# Patient Record
Sex: Female | Born: 1950
Health system: Southern US, Community
[De-identification: ages and names within clinical notes are randomized; demographics above are authoritative.]

## PROBLEM LIST (undated history)

## (undated) DIAGNOSIS — K219 Gastro-esophageal reflux disease without esophagitis: Secondary | ICD-10-CM

## (undated) DIAGNOSIS — T7840XA Allergy, unspecified, initial encounter: Secondary | ICD-10-CM

## (undated) DIAGNOSIS — B009 Herpesviral infection, unspecified: Secondary | ICD-10-CM

## (undated) DIAGNOSIS — K5900 Constipation, unspecified: Secondary | ICD-10-CM

## (undated) DIAGNOSIS — E785 Hyperlipidemia, unspecified: Secondary | ICD-10-CM

## (undated) DIAGNOSIS — H269 Unspecified cataract: Secondary | ICD-10-CM

## (undated) HISTORY — PX: EXPLORATORY LAPAROTOMY: SUR591

## (undated) HISTORY — PX: WISDOM TOOTH EXTRACTION: SHX21

## (undated) HISTORY — DX: Unspecified cataract: H26.9

## (undated) HISTORY — PX: RETINAL DETACHMENT SURGERY: SHX105

## (undated) HISTORY — PX: CATARACT EXTRACTION: SUR2

## (undated) HISTORY — PX: APPENDECTOMY: SHX54

## (undated) HISTORY — DX: Hyperlipidemia, unspecified: E78.5

## (undated) HISTORY — DX: Gastro-esophageal reflux disease without esophagitis: K21.9

## (undated) HISTORY — PX: VITRECTOMY: SHX106

## (undated) HISTORY — PX: EYE SURGERY: SHX253

## (undated) HISTORY — DX: Herpesviral infection, unspecified: B00.9

## (undated) HISTORY — DX: Constipation, unspecified: K59.00

## (undated) HISTORY — PX: VITRECTOMY AND CATARACT: SHX6184

## (undated) HISTORY — DX: Allergy, unspecified, initial encounter: T78.40XA

---

## 2000-03-04 ENCOUNTER — Encounter: Payer: Self-pay | Admitting: Family Medicine

## 2000-03-04 ENCOUNTER — Encounter: Admission: RE | Admit: 2000-03-04 | Discharge: 2000-03-04 | Payer: Self-pay | Admitting: Family Medicine

## 2001-04-01 ENCOUNTER — Encounter: Payer: Self-pay | Admitting: Family Medicine

## 2001-04-01 ENCOUNTER — Encounter: Admission: RE | Admit: 2001-04-01 | Discharge: 2001-04-01 | Payer: Self-pay | Admitting: Family Medicine

## 2002-09-09 ENCOUNTER — Encounter: Admission: RE | Admit: 2002-09-09 | Discharge: 2002-09-09 | Payer: Self-pay | Admitting: Family Medicine

## 2002-09-09 ENCOUNTER — Encounter: Payer: Self-pay | Admitting: Family Medicine

## 2003-09-11 ENCOUNTER — Encounter: Admission: RE | Admit: 2003-09-11 | Discharge: 2003-09-11 | Payer: Self-pay | Admitting: Family Medicine

## 2003-09-22 ENCOUNTER — Other Ambulatory Visit: Admission: RE | Admit: 2003-09-22 | Discharge: 2003-09-22 | Payer: Self-pay | Admitting: Internal Medicine

## 2004-09-16 ENCOUNTER — Encounter: Admission: RE | Admit: 2004-09-16 | Discharge: 2004-09-16 | Payer: Self-pay | Admitting: Family Medicine

## 2004-11-14 ENCOUNTER — Other Ambulatory Visit: Admission: RE | Admit: 2004-11-14 | Discharge: 2004-11-14 | Payer: Self-pay | Admitting: Family Medicine

## 2005-07-10 ENCOUNTER — Encounter: Admission: RE | Admit: 2005-07-10 | Discharge: 2005-07-10 | Payer: Self-pay | Admitting: Family Medicine

## 2005-10-16 ENCOUNTER — Encounter: Admission: RE | Admit: 2005-10-16 | Discharge: 2005-10-16 | Payer: Self-pay | Admitting: Family Medicine

## 2005-11-26 ENCOUNTER — Encounter: Payer: Self-pay | Admitting: Family Medicine

## 2006-08-19 ENCOUNTER — Ambulatory Visit: Payer: Self-pay | Admitting: Family Medicine

## 2006-10-19 ENCOUNTER — Encounter: Admission: RE | Admit: 2006-10-19 | Discharge: 2006-10-19 | Payer: Self-pay | Admitting: Family Medicine

## 2006-11-02 ENCOUNTER — Ambulatory Visit: Payer: Self-pay | Admitting: Family Medicine

## 2006-11-03 LAB — CONVERTED CEMR LAB
CO2: 30 meq/L (ref 19–32)
Chloride: 103 meq/L (ref 96–112)
Creatinine, Ser: 0.9 mg/dL (ref 0.4–1.2)
GFR calc non Af Amer: 69 mL/min
Potassium: 4.3 meq/L (ref 3.5–5.1)
Sodium: 138 meq/L (ref 135–145)

## 2006-11-04 ENCOUNTER — Encounter: Payer: Self-pay | Admitting: Family Medicine

## 2006-11-10 ENCOUNTER — Ambulatory Visit: Payer: Self-pay | Admitting: Family Medicine

## 2006-11-10 LAB — CONVERTED CEMR LAB
Basophils Relative: 0.9 % (ref 0.0–1.0)
Cholesterol: 208 mg/dL (ref 0–200)
Eosinophils Relative: 5.5 % — ABNORMAL HIGH (ref 0.0–5.0)
HCT: 42.7 % (ref 36.0–46.0)
HDL: 61.2 mg/dL (ref >39.0)
Hemoglobin: 14.7 g/dL (ref 12.0–15.0)
MCV: 95.8 fL (ref 78.0–100.0)
Monocytes Absolute: 0.5 10*3/uL (ref 0.2–0.7)
Monocytes Relative: 7.2 % (ref 3.0–11.0)
Neutrophils Relative %: 61.3 % (ref 43.0–77.0)
RDW: 12.5 % (ref 11.5–14.6)
TSH: 4.41 microintl units/mL (ref 0.35–5.50)
Triglycerides: 90 mg/dL (ref 0–149)
VLDL: 18 mg/dL (ref 0–40)
WBC: 7.3 10*3/uL (ref 4.5–10.5)

## 2007-10-22 ENCOUNTER — Encounter: Admission: RE | Admit: 2007-10-22 | Discharge: 2007-10-22 | Payer: Self-pay | Admitting: Family Medicine

## 2007-10-27 ENCOUNTER — Ambulatory Visit: Payer: Self-pay | Admitting: Family Medicine

## 2007-10-27 DIAGNOSIS — S6390XA Sprain of unspecified part of unspecified wrist and hand, initial encounter: Secondary | ICD-10-CM | POA: Insufficient documentation

## 2007-12-08 ENCOUNTER — Ambulatory Visit: Payer: Self-pay | Admitting: Family Medicine

## 2007-12-09 ENCOUNTER — Encounter (INDEPENDENT_AMBULATORY_CARE_PROVIDER_SITE_OTHER): Payer: Self-pay | Admitting: *Deleted

## 2007-12-09 LAB — CONVERTED CEMR LAB
HDL: 55.3 mg/dL (ref >39.0)
TSH: 2.85 microintl units/mL (ref 0.35–5.50)
Triglycerides: 194 mg/dL — ABNORMAL HIGH (ref 0–149)
VLDL: 39 mg/dL (ref 0–40)

## 2008-09-15 ENCOUNTER — Encounter (INDEPENDENT_AMBULATORY_CARE_PROVIDER_SITE_OTHER): Payer: Self-pay | Admitting: *Deleted

## 2008-10-24 ENCOUNTER — Encounter: Admission: RE | Admit: 2008-10-24 | Discharge: 2008-10-24 | Payer: Self-pay | Admitting: Obstetrics and Gynecology

## 2008-12-12 ENCOUNTER — Ambulatory Visit: Payer: Self-pay | Admitting: Family Medicine

## 2008-12-13 LAB — CONVERTED CEMR LAB
AST: 31 units/L (ref 0–37)
Albumin: 4 g/dL (ref 3.5–5.2)
Basophils Absolute: 0 10*3/uL (ref 0.0–0.1)
Bilirubin, Direct: 0.1 mg/dL (ref 0.0–0.3)
Calcium: 9.5 mg/dL (ref 8.4–10.5)
Creatinine, Ser: 0.9 mg/dL (ref 0.4–1.2)
Eosinophils Absolute: 0.4 10*3/uL (ref 0.0–0.7)
GFR calc Af Amer: 83 mL/min
GFR calc non Af Amer: 69 mL/min
HCT: 40 % (ref 36.0–46.0)
HDL: 60.5 mg/dL (ref >39.0)
Hemoglobin: 14.1 g/dL (ref 12.0–15.0)
Lymphocytes Relative: 23.3 % (ref 12.0–46.0)
MCV: 95.6 fL (ref 78.0–100.0)
Potassium: 4.5 meq/L (ref 3.5–5.1)
RDW: 12.4 % (ref 11.5–14.6)
Sodium: 142 meq/L (ref 135–145)
Total Bilirubin: 0.6 mg/dL (ref 0.3–1.2)
Total CHOL/HDL Ratio: 3.6
Total Protein: 7.2 g/dL (ref 6.0–8.3)

## 2009-10-25 ENCOUNTER — Encounter: Admission: RE | Admit: 2009-10-25 | Discharge: 2009-10-25 | Payer: Self-pay | Admitting: Obstetrics and Gynecology

## 2009-10-31 ENCOUNTER — Encounter: Admission: RE | Admit: 2009-10-31 | Discharge: 2009-10-31 | Payer: Self-pay | Admitting: Obstetrics and Gynecology

## 2009-12-18 ENCOUNTER — Encounter: Payer: Self-pay | Admitting: Family Medicine

## 2009-12-18 ENCOUNTER — Encounter (INDEPENDENT_AMBULATORY_CARE_PROVIDER_SITE_OTHER): Payer: Self-pay | Admitting: *Deleted

## 2009-12-18 ENCOUNTER — Ambulatory Visit: Payer: Self-pay | Admitting: Family

## 2009-12-18 DIAGNOSIS — K5909 Other constipation: Secondary | ICD-10-CM | POA: Insufficient documentation

## 2009-12-18 LAB — CONVERTED CEMR LAB
AST: 27 units/L (ref 0–37)
BUN: 10 mg/dL (ref 6–23)
Basophils Absolute: 0.1 10*3/uL (ref 0.0–0.1)
Bilirubin, Direct: 0.1 mg/dL (ref 0.0–0.3)
CO2: 30 meq/L (ref 19–32)
Calcium: 9.5 mg/dL (ref 8.4–10.5)
Cholesterol: 213 mg/dL — ABNORMAL HIGH (ref 0–200)
Creatinine, Ser: 0.8 mg/dL (ref 0.4–1.2)
Eosinophils Absolute: 0.4 10*3/uL (ref 0.0–0.7)
Eosinophils Relative: 4.6 % (ref 0.0–5.0)
Glucose, Bld: 80 mg/dL (ref 70–99)
HCT: 41.6 % (ref 36.0–46.0)
HDL: 73 mg/dL (ref >39.00)
Hemoglobin: 14 g/dL (ref 12.0–15.0)
Neutrophils Relative %: 65.1 % (ref 43.0–77.0)
Platelets: 301 10*3/uL (ref 150.0–400.0)
Potassium: 3.8 meq/L (ref 3.5–5.1)
RDW: 12.5 % (ref 11.5–14.6)
Total CHOL/HDL Ratio: 3
Total Protein: 7.3 g/dL (ref 6.0–8.3)

## 2010-01-07 ENCOUNTER — Ambulatory Visit: Payer: Self-pay | Admitting: Family

## 2010-08-14 ENCOUNTER — Telehealth (INDEPENDENT_AMBULATORY_CARE_PROVIDER_SITE_OTHER): Payer: Self-pay | Admitting: *Deleted

## 2010-11-07 ENCOUNTER — Other Ambulatory Visit: Payer: Self-pay | Admitting: Family Medicine

## 2010-11-07 ENCOUNTER — Ambulatory Visit
Admission: RE | Admit: 2010-11-07 | Discharge: 2010-11-07 | Payer: Self-pay | Source: Home / Self Care | Attending: Family Medicine | Admitting: Family Medicine

## 2010-11-07 DIAGNOSIS — R238 Other skin changes: Secondary | ICD-10-CM | POA: Insufficient documentation

## 2010-11-07 DIAGNOSIS — K219 Gastro-esophageal reflux disease without esophagitis: Secondary | ICD-10-CM | POA: Insufficient documentation

## 2010-11-07 DIAGNOSIS — R1310 Dysphagia, unspecified: Secondary | ICD-10-CM | POA: Insufficient documentation

## 2010-11-07 LAB — H. PYLORI ANTIBODY, IGG: H Pylori IgG: NEGATIVE

## 2010-11-10 ENCOUNTER — Encounter: Payer: Self-pay | Admitting: Family Medicine

## 2010-11-10 ENCOUNTER — Encounter: Payer: Self-pay | Admitting: Obstetrics and Gynecology

## 2010-11-19 NOTE — Letter (Signed)
   [facility] [address] [place], [place] [address] [phone]   January 07, 2010   PINA SIRIANNI [address] [place], [place] [address]  RE:  LAB RESULTS  Dear  Ms. Friesen,  The following is an interpretation of your most recent lab tests.  Please take note of any instructions provided or changes to medications that have resulted from your lab work.  Hemoccult Test for blood in stool:  negative      Sincerely Yours,    Lemont Fillers FNP

## 2010-11-19 NOTE — Assessment & Plan Note (Signed)
Summary: CPX/NS/KDC   Vital Signs:  Patient profile:   60 year old female Weight:      158.50 pounds BMI:     25.10 Temp:     98.1 degrees F oral Pulse rate:   76 / minute Pulse rhythm:   regular Resp:     16 per minute BP sitting:   130 / 82 Cuff size:   regular  Vitals Entered By: Mervin Kung CMA (December 18, 2009 10:10 AM) CC: room 17 Annual physical Is Patient Diabetic? No Comments Wants cholesterol checked Pt wants a  bone density test.     Last PAP Result normal   CC:  room 17 Annual physical.  History of Present Illness: [name] is a 60 year old female who presents today for CPX.    Constipation-  Notes that she has been using prunes with minimal relief.  Has 3 hard BM's a week.  Preventative- recent mammo 1/11 normal, scheduled for PAP in a few weeks.  Normal colo 2006.  + excercise- walks for 20-25 minutes.  Diet is "alright" could eat more fruits and veggies   Preventive Screening-Counseling & Management  Alcohol-Tobacco     Smoking Status: never  Allergies: 1)  ! Cortisone  Past History:  Past Medical History: Last updated: 12/12/2008 herpes  Past Surgical History: Last updated: 12/08/2007 Appendectomy Laparotomy-exploratory  Family History: Last updated: 12/12/2008 CAD - 2 sisters with an arrythmia HTN - sisters DM - no stroke - F colon Ca - no breast Ca - no F deceased - pneumonia M deceased - CHF age 72  Social History: Last updated: 12/18/2009 Occupation: works as Network engineer professor Never Smoked Alcohol use-yes: wine weekly- 5 glasses/week Drug use-no Regular exercise-yes  Risk Factors: Exercise: yes (12/08/2007)  Risk Factors: Smoking Status: never (12/18/2009)  Family History: Reviewed history from 12/12/2008 and no changes required. 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 Resp: Denies cough CV:  Denies Chest Pain GI:  Denies nausea or vomitting GU: Denies dysuria Lymphatic: Denies lymphadenopathy Musculoskeletal:  Denies muscle, occasional left knee pain Skin:  Denies Rashes Psychiatric: Denies depression or anxiety Neuro: Denies numbness     Physical Exam  General:  Well-developed,well-nourished,in no acute distress; alert,appropriate and cooperative throughout examination Head:  Normocephalic and atraumatic without obvious abnormalities. No apparent alopecia or balding. Eyes:  PERRLA Ears:  External ear exam shows no significant lesions or deformities.  Otoscopic examination reveals clear canals, tympanic membranes are intact bilaterally without bulging, retraction, inflammation or discharge. Hearing is grossly normal bilaterally. Mouth:  Oral mucosa and oropharynx without lesions or exudates.  Teeth in good repair. Neck:  No deformities, masses, or tenderness noted. Breasts:  declined- pt to see GYN Lungs:  Normal respiratory effort, chest expands symmetrically. Lungs are clear to auscultation, no crackles or wheezes. Heart:  Normal rate and regular rhythm. S1 and S2 normal without gallop, murmur, click, rub or other extra sounds. Abdomen:  Bowel sounds positive,abdomen soft and non-tender without masses, organomegaly or hernias noted. Msk:  No deformity or scoliosis noted of thoracic or lumbar spine.  Neurologic:  No cranial nerve deficits noted. Station and gait are normal. Plantar reflexes are down-going bilaterally. DTRs are symmetrical throughout. Sensory, motor and coordinative functions appear intact. Skin:  Intact without suspicious lesions or rashes Psych:  Cognition and judgment appear  intact. Alert and cooperative with normal attention span and concentration. No apparent delusions, illusions, hallucinations   Impression & Recommendations:  Problem # 1:  PREVENTIVE HEALTH CARE (ICD-V70.0) Assessment Comment Only EKG NSR rate 60 BPM.  Counselled on diet and exercise.  Immunizations reviewed.   Orders: Venipuncture (47829) TLB-Lipid Panel (80061-LIPID) TLB-CBC Platelet - w/Differential (85025-CBCD) TLB-BMP (Basic Metabolic Panel-BMET) (80048-METABOL) TLB-Hepatic/Liver Function Pnl (80076-HEPATIC)  Problem # 2:  CONSTIPATION, CHRONIC (ICD-564.09) Assessment: New Recommeded trial of colace, increase H2O intake and increase fiber Her updated medication list for this problem includes:    Colace 100 Mg Caps (Docusate sodium) ..... One cap by mouth two times a day  Complete Medication List: 1)  Valtrex 1 Gm Tabs (Valacyclovir hcl) .... Take one tablet daily 2)  Colace 100 Mg Caps (Docusate sodium) .... One cap by mouth two times a day  Patient Instructions: 1)  Drink 6-8 glasses of water a day. 2)  Try to eat more fruits and veggies 3)  Talk to your GYN about scheduling a bone density.   Current Allergies (reviewed today): ! CORTISONE   Preventive Care Screening  Pap Smear:    Date:  12/18/2009    Results:  normal  Mammogram:    Date:  10/22/2009    Results:  normal

## 2010-11-19 NOTE — Letter (Signed)
Summary: Cancer Screening/[facility] Personalized Risk Profile  Cancer Screening/[facility] Personalized Risk Profile   Imported By: Lanelle Bal 12/25/2009 11:55:13  _____________________________________________________________________  External Attachment:    Type:   Image     Comment:   External Document

## 2010-11-19 NOTE — Letter (Signed)
   [facility] [address] [place], [place] [address] [phone]    December 18, 2009   Leea Werber [address] [place], [place] [address]  RE:  LAB RESULTS  Dear  Ms. Tipps,  The following is an interpretation of your most recent lab tests.  Please take note of any instructions provided or changes to medications that have resulted from your lab work.  ELECTROLYTES:  Good - no changes needed  KIDNEY FUNCTION TESTS:  Good - no changes needed  LIVER FUNCTION TESTS:  Good - no changes needed  LIPID PANEL:  Stable - no changes needed Triglyceride: 106.0   Cholesterol: 213   LDL: DEL   HDL: 73.00   Chol/HDL%:  3   DIABETIC STUDIES:  Excellent - no changes needed Blood Glucose: 80     CBC:  Good - no changes needed   Sincerely Yours,    Lemont Fillers FNP

## 2010-11-19 NOTE — Letter (Signed)
Summary: [facility]: Immunoassay Fecal Occult Blood (iFOB) Order Form  [facility] [place]  [address] [place], [place] [address]   Phone: [phone]  Fax: [phone]      [facility]: Immunoassay Fecal Occult Blood (iFOB) Order Form   December 18, 2009 MRN: [id number]   KALEB LINQUIST [birth year]   Physicican Name:____Melissa O'Sullivan__________  Diagnosis Code:_____v70.0_____________________      Mervin Kung CMA

## 2010-11-19 NOTE — Progress Notes (Signed)
Summary: Valacyclovir refill at different pharmacy  Phone Note Refill Request Message from:  Patient on August 14, 2010 12:58 PM  Refills Requested: Medication #1:  VALTREX 1 GM  TABS Take one tablet daily patient was using [facility], but now wants to use [facility] [address] because prescription is cheaper at [facility] wants the generic Valacyclovir  Initial call taken by: Jerolyn Shin,  August 14, 2010 1:00 PM    Prescriptions: VALTREX 1 GM  TABS (VALACYCLOVIR HCL) Take one tablet daily  #30 Each x 1   Entered by:   Doristine Devoid CMA   Authorized by:   Neena Rhymes MD   Signed by:   Doristine Devoid CMA on 08/14/2010   Method used:   Electronically to        [facility] [address] (retail)       [phone]       [place], [place]  [address]       Ph: [phone]       Fax: [phone]   RxID:   [id number]

## 2010-11-21 NOTE — Assessment & Plan Note (Signed)
Summary: problems with acid reflux/nta   Vital Signs:  Patient profile:   60 year old female Weight:      165 pounds BMI:     26.13 Pulse rate:   76 / minute BP sitting:   116 / 80  (left arm)  Vitals Entered By: Doristine Devoid CMA (November 07, 2010 1:33 PM) CC: reflux worse at night tried Tums w/ some relief and check spot on R inner thigh   History of Present Illness: 60 yo woman here today for  1) GERD- sxs occuring nightly.  has tried Tums w/ some relief, licorice extract (made her sick).  is having burning up into chest.  will have feeling of food sticking 1/2 down, doesn't occur every time she eats and doesn't occur w/ liquids.  has never had similar sxs.  has never had endoscopy.  attempts to stop eating before 7 pm.  sxs severity will vary based on intake.  no recent wt loss.  no vomiting.  2) hyperpigmentation of R inner thigh- doesn't itch or flake, no pain, no similar lesions elsewhere.  pt thinks it appeared after she had sun exposure and her tan faded.  not sure if she should be concerned about this.  fears skin cancer  Preventive Screening-Counseling & Management  Alcohol-Tobacco     Alcohol drinks/day: <1     Smoking Status: never  Current Medications (verified): 1)  Valtrex 1 Gm  Tabs (Valacyclovir Hcl) .... Take One Tablet Daily 2)  Colace 100 Mg Caps (Docusate Sodium) .... One Cap By Mouth Two Times A Day  Allergies (verified): 1)  ! Cortisone  Past History:  Past medical, surgical, family and social histories (including risk factors) reviewed, and no changes noted (except as noted below).  Past Medical History: herpes GERD  Past Surgical History: Reviewed history from 12/08/2007 and no changes required. Appendectomy Laparotomy-exploratory  Family History: Reviewed history from 12/12/2008 and no changes required. CAD - 2 sisters with an arrythmia HTN - sisters DM - no stroke - F colon Ca - no breast Ca - no F deceased - pneumonia M deceased -  CHF age 52  Social History: Reviewed history from 12/18/2009 and no changes required. 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 Lungs:  Normal respiratory effort, chest expands symmetrically. Lungs are clear to auscultation, no crackles or wheezes. Heart:  Normal rate and regular rhythm. S1 and S2 normal without gallop, murmur, click, rub or other extra sounds. Abdomen:  Bowel sounds positive,abdomen soft and non-tender without masses, organomegaly or hernias noted. Skin:  hyperpigmenation on R inner thigh- not concerning   Impression & Recommendations:  Problem # 1:  GERD (ICD-530.81) Assessment New pt w/ worsening sxs.  start PPI.  reviewed lifestyle modifications- many of which pt is already doing.  will follow.  if no improvement in her dysphagia w/ the reduction in esophageal inflammation will need GI referral.  Pt expresses understanding and is in agreement w/ this plan. Her updated medication list for this problem includes:    Omeprazole 40 Mg Cpdr (Omeprazole) .Marland Kitchen... 1 tab by mouth daily  Orders: Venipuncture (04540) Specimen Handling (98119) TLB-H. Pylori Abs(Helicobacter Pylori) (86677-HELICO)  Problem # 2:  OTH SYMPTOMS INVOLVING SKIN&INTEG TISSUES (ICD-782.9) Assessment: New  area in question is not concerning for cancer which is pt's biggest fear.  likely pigmentation changes due to sun exposure.  Problem # 3:  DYSPHAGIA (ICD-787.20) Assessment: New likely due to esophageal inflammation caused by GERD.  if no improvement in sxs after 3-4 weeks of PPI will refer to GI.  Pt expresses understanding and is in agreement w/ this plan.  Complete Medication  List: 1)  Valtrex 1 Gm Tabs (Valacyclovir hcl) .... Take one tablet daily 2)  Colace 100 Mg Caps (Docusate sodium) .... One cap by mouth two times a day 3)  Omeprazole 40 Mg Cpdr (Omeprazole) .Marland Kitchen.. 1 tab by mouth daily  Patient Instructions: 1)  Schedule your complete physical for March- do not eat before this appt 2)  Take the Omeprazole daily for the reflux 3)  If the feeling of food sticking does not improve in the next 3-4 weeks- please call 4)  We'll notify you of your lab results 5)  Please keep track of your light symptom- when it occurs, how long it lasts, what you were doing, etc 6)  Call with any questions or concerns! 7)  Happy New Year!!! Prescriptions: OMEPRAZOLE 40 MG CPDR (OMEPRAZOLE) 1 tab by mouth daily  #30 x 6   Entered and Authorized by:   Neena Rhymes MD   Signed by:   Neena Rhymes MD on 11/07/2010   Method used:   Electronically to        [facility] [address] (retail)       [phone]       [place], [place]  [address]       Ph: [phone]       Fax: [phone]   RxID:   [id number]    Orders Added: 1)  Venipuncture [38756] 2)  Specimen Handling [99000] 3)  TLB-H. Pylori Abs(Helicobacter Pylori) [86677-HELICO] 4)  Est. Patient Level IV [43329]

## 2010-12-10 ENCOUNTER — Encounter: Payer: Self-pay | Admitting: Family Medicine

## 2010-12-20 ENCOUNTER — Encounter: Payer: Self-pay | Admitting: Family Medicine

## 2010-12-20 ENCOUNTER — Encounter (INDEPENDENT_AMBULATORY_CARE_PROVIDER_SITE_OTHER): Payer: BC Managed Care – PPO | Admitting: Family Medicine

## 2010-12-20 ENCOUNTER — Other Ambulatory Visit: Payer: Self-pay | Admitting: Family Medicine

## 2010-12-20 DIAGNOSIS — Z0000 Encounter for general adult medical examination without abnormal findings: Secondary | ICD-10-CM

## 2010-12-20 DIAGNOSIS — H5310 Unspecified subjective visual disturbances: Secondary | ICD-10-CM | POA: Insufficient documentation

## 2010-12-20 LAB — BASIC METABOLIC PANEL
BUN: 11 mg/dL (ref 6–23)
CO2: 29 mEq/L (ref 19–32)
Calcium: 9.4 mg/dL (ref 8.4–10.5)
Chloride: 106 mEq/L (ref 96–112)
Glucose, Bld: 84 mg/dL (ref 70–99)
Potassium: 4.4 mEq/L (ref 3.5–5.1)

## 2010-12-20 LAB — CBC WITH DIFFERENTIAL/PLATELET
Basophils Relative: 1.1 % (ref 0.0–3.0)
HCT: 40.3 % (ref 36.0–46.0)
Hemoglobin: 13.7 g/dL (ref 12.0–15.0)
Lymphocytes Relative: 23.8 % (ref 12.0–46.0)
Monocytes Absolute: 0.6 10*3/uL (ref 0.1–1.0)
Neutro Abs: 7.5 10*3/uL (ref 1.4–7.7)
Neutrophils Relative %: 66.7 % (ref 43.0–77.0)
Platelets: 308 10*3/uL (ref 150.0–400.0)
RBC: 4.12 Mil/uL (ref 3.87–5.11)

## 2010-12-20 LAB — TSH: TSH: 3.65 u[IU]/mL (ref 0.35–5.50)

## 2010-12-20 LAB — HEPATIC FUNCTION PANEL
AST: 31 U/L (ref 0–37)
Alkaline Phosphatase: 68 U/L (ref 39–117)
Bilirubin, Direct: 0.1 mg/dL (ref 0.0–0.3)
Total Protein: 6.7 g/dL (ref 6.0–8.3)

## 2010-12-20 LAB — LIPID PANEL
Cholesterol: 192 mg/dL (ref 0–200)
VLDL: 13.4 mg/dL (ref 0.0–40.0)

## 2010-12-21 ENCOUNTER — Encounter: Payer: Self-pay | Admitting: Family Medicine

## 2010-12-23 ENCOUNTER — Other Ambulatory Visit: Payer: Self-pay | Admitting: Family Medicine

## 2010-12-23 DIAGNOSIS — H539 Unspecified visual disturbance: Secondary | ICD-10-CM

## 2010-12-26 ENCOUNTER — Ambulatory Visit (INDEPENDENT_AMBULATORY_CARE_PROVIDER_SITE_OTHER)
Admission: RE | Admit: 2010-12-26 | Discharge: 2010-12-26 | Disposition: A | Discharge status: Home / Self Care | Payer: BC Managed Care – PPO | Source: Ambulatory Visit | Attending: Family Medicine | Admitting: Family Medicine

## 2010-12-26 DIAGNOSIS — H539 Unspecified visual disturbance: Secondary | ICD-10-CM

## 2010-12-31 NOTE — Assessment & Plan Note (Signed)
Summary: CPX/PH   Vital Signs:  Patient profile:   60 year old female Height:      66.75 inches (169.55 cm) Weight:      165.13 pounds (75.06 kg) BMI:     26.15 Temp:     98.4 degrees F (36.89 degrees C) oral BP sitting:   112 / 72  (right arm) Cuff size:   regular  Vitals Entered By: Lucious Groves CMA (December 20, 2010 8:06 AM) CC: Fasting CPX. NO pap./kb Is Patient Diabetic? No Pain Assessment Patient in pain? no        History of Present Illness: 60 yo woman here today for CPE.  GYN- [facility].  Colonoscopy- 2007  GERD- sxs disappeared when taking Omeprazole, 'it was like a magic pill'.  sxs recur when pt stops meds.  Preventive Screening-Counseling & Management  Alcohol-Tobacco     Alcohol drinks/day: <1     Smoking Status: never  Caffeine-Diet-Exercise     Does Patient Exercise: no      Drug Use:  never.    Current Medications (verified): 1)  Valtrex 1 Gm  Tabs (Valacyclovir Hcl) .... Take One Tablet Daily 2)  Colace 100 Mg Caps (Docusate Sodium) .... One Cap By Mouth Two Times A Day 3)  Omeprazole 40 Mg Cpdr (Omeprazole) .Marland Kitchen.. 1 Tab By Mouth Daily 4)  Mvi .... Daily 5)  Fish Oil .... Daily 6)  Ca+ .... Daily 7)  Probiotics .... Daily 8)  Occult Vitamin (Name and Dosage Unknown) .... Daily  Allergies (verified): 1)  ! Cortisone  Past History:  Past medical, surgical, family and social histories (including risk factors) reviewed, and no changes noted (except as noted below).  Past Medical History: Reviewed history from 11/07/2010 and no changes required. herpes GERD  Past Surgical History: Reviewed history from 12/08/2007 and no changes required. Appendectomy Laparotomy-exploratory  Family History: Reviewed history from 12/12/2008 and no changes required. CAD - 2 sisters with an arrythmia HTN - sisters DM - no stroke - F colon Ca - no breast Ca - no F deceased - pneumonia M deceased - CHF age 39  Social History: Reviewed history from  12/18/2009 and no changes required. 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         having 'white cloud' across superior field of vision- fleeting, has had eyes examined and they were normal.  will sometimes have associated sinus HA.  Physical Exam  General:  Well-developed,well-nourished,in no acute distress; alert,appropriate and cooperative throughout examination Head:  Normocephalic and atraumatic without obvious abnormalities. No apparent alopecia or balding. Eyes:  No corneal or conjunctival inflammation noted. EOMI. Perrla. Funduscopic exam benign, without hemorrhages, exudates or papilledema. Vision grossly normal. Ears:  External ear exam shows no significant lesions or deformities.  Otoscopic examination reveals clear canals, tympanic membranes are intact bilaterally without bulging, retraction, inflammation or discharge. Hearing is grossly normal bilaterally. Nose:  External nasal examination shows no deformity or inflammation. Nasal mucosa are pink and moist without lesions or exudates. Mouth:  Oral mucosa and oropharynx without lesions or exudates.  Teeth in good  repair. Neck:  No deformities, masses, or tenderness noted. Breasts:  declined- pt to see GYN Lungs:  Normal respiratory effort, chest expands symmetrically. Lungs are clear to auscultation, no crackles or wheezes. Heart:  Normal rate and regular rhythm. S1 and S2 normal without gallop, murmur,  click, rub or other extra sounds. Abdomen:  Bowel sounds positive,abdomen soft and non-tender without masses, organomegaly or hernias noted. Genitalia:  gyn Pulses:  +2 carotid, radial, DP Extremities:  No clubbing, cyanosis, edema, or deformity noted with normal full range of motion of all joints.   Neurologic:  No cranial nerve deficits noted. Station and gait are normal. Plantar reflexes are down-going bilaterally. DTRs are symmetrical throughout. Sensory, motor and coordinative functions appear intact. Skin:  hyperpigmenation on R inner thigh- not concerning Cervical Nodes:  No lymphadenopathy noted Axillary Nodes:  No palpable lymphadenopathy Psych:  Cognition and judgment appear intact. Alert and cooperative with normal attention span and concentration. No apparent delusions, illusions, hallucinations   Impression & Recommendations:  Problem # 1:  PREVENTIVE HEALTH CARE (ICD-V70.0) Assessment Unchanged pt's PE WNL.  check labs.  UTD on GYN and colonoscopy.  anticipatory guidance provided.   Orders: Venipuncture (51884) T-Vitamin D (25-Hydroxy) (16606-30160) Specimen Handling (10932) TLB-Lipid Panel (80061-LIPID) TLB-BMP (Basic Metabolic Panel-BMET) (80048-METABOL) TLB-CBC Platelet - w/Differential (85025-CBCD) TLB-Hepatic/Liver Function Pnl (80076-HEPATIC) TLB-TSH (Thyroid Stimulating Hormone) (84443-TSH)  Problem # 2:  VISUAL CHANGES (ICD-368.10) Assessment: New  given that eye exam was normal but pt continues to have 'white clouds' intermittantly along superior visual field will get CT scan and refer to Neuro for evaluation.  Orders: Radiology Referral (Radiology) Neurology Referral (Neuro)  Complete Medication List: 1)  Valtrex 1 Gm Tabs (Valacyclovir hcl) .... Take one tablet daily 2)  Colace 100 Mg Caps (Docusate sodium) .... One cap by mouth two times a day 3)  Omeprazole 40 Mg Cpdr (Omeprazole) .Marland Kitchen.. 1 tab by mouth daily 4)  Mvi  .... Daily 5)  Fish Oil  ....  Daily 6)  Ca+  .... Daily 7)  Probiotics  .... Daily 8)  Occult Vitamin (name and Dosage Unknown)  .... Daily  Patient Instructions: 1)  We'll notify you of your CT appt 2)  We'll notify you of your lab results 3)  Call and set up your appt with Dermatology 4)  Call with any questions or concerns 5)  Your exam looks great! 6)  Have a great weekend!   Orders Added: 1)  Venipuncture [36415] 2)  T-Vitamin D (25-Hydroxy) [phone] 3)  Specimen Handling [99000] 4)  TLB-Lipid Panel [80061-LIPID] 5)  TLB-BMP (Basic Metabolic Panel-BMET) [80048-METABOL] 6)  TLB-CBC Platelet - w/Differential [85025-CBCD] 7)  TLB-Hepatic/Liver Function Pnl [80076-HEPATIC] 8)  TLB-TSH (Thyroid Stimulating Hormone) [84443-TSH] 9)  Radiology Referral [Radiology] 10)  Neurology Referral [Neuro] 11)  Est. Patient 40-64 years [99396] 12)  Est. Patient Level II [42706]

## 2011-01-03 ENCOUNTER — Other Ambulatory Visit: Payer: Self-pay | Admitting: Family Medicine

## 2011-01-03 ENCOUNTER — Encounter (INDEPENDENT_AMBULATORY_CARE_PROVIDER_SITE_OTHER): Payer: Self-pay | Admitting: *Deleted

## 2011-01-03 ENCOUNTER — Other Ambulatory Visit (INDEPENDENT_AMBULATORY_CARE_PROVIDER_SITE_OTHER): Payer: BC Managed Care – PPO

## 2011-01-03 DIAGNOSIS — D7289 Other specified disorders of white blood cells: Secondary | ICD-10-CM

## 2011-01-03 LAB — CBC WITH DIFFERENTIAL/PLATELET
Basophils Relative: 0.2 % (ref 0.0–3.0)
Lymphocytes Relative: 24.4 % (ref 12.0–46.0)
Lymphs Abs: 2.6 10*3/uL (ref 0.7–4.0)
Neutro Abs: 7.2 10*3/uL (ref 1.4–7.7)
Platelets: 330 10*3/uL (ref 150.0–400.0)
RBC: 4.34 Mil/uL (ref 3.87–5.11)
RDW: 13 % (ref 11.5–14.6)
WBC: 10.6 10*3/uL — ABNORMAL HIGH (ref 4.5–10.5)

## 2011-03-07 NOTE — Assessment & Plan Note (Signed)
[facility]                        [facility] [facility] OFFICE NOTE   NAME:Danielle Daniel, Danielle Daniel                     MRN:          [id number]  DATE:11/02/2006                            DOB:          07-15-1951    Right flank pain.   [name] is a 60 year old female who reports since the last visit  her pain had improved but never went away.  She states that she did not  return secondary to doesn't like to come to doctors.  Now more  recently, the pain has become sharp with an underlying dull ache in the  right flank.  No other associated symptoms.  Pain is definitely  precipitated with certain movement of the upper torso.  She denied any  chest pain, shortness of breath or dyspnea on exertion.  She denied any  GI symptoms.  She denied any dysuria, urinary frequency, blood in urine.   MEDICATION:  1. Valtrex.  2. Calcium.  3. Lysine.   SURGICAL HISTORY:  Appendectomy.  In February 2007 she had a colonoscopy  which was unremarkable.   OBJECTIVE:  VITAL SIGNS:  Weight 155, temperature 97.4, blood pressure  132/90.  GENERAL:  We have a pleasant female who appears nervous but in no acute  distress, answers questions appropriately, alert and oriented x3.  LUNGS:  Clear.  HEART:  Regular rate and rhythm.  No murmurs, gallops or rubs heard on  my examination.  ABDOMEN:  Palpation of the abdomen significant for no palpable masses,  no hepatosplenomegaly, no rebound or guarding.  There is mild tenderness  just below the right rib cage on the flank area.  No palpable masses  were noted, even with deep palpation.   Urine significant for trace blood and leukocytes, but otherwise  negative.   IMPRESSION:  A 60 year old female with several-month history of right  flank pain, initially appeared to be musculoskeletal in nature.  Symptoms continue to be amplified with certain movements.  Given that  pain continues even after decrease in physical activity,  further  evaluation is warranted.   PLAN:  1. Will send urine for culture, given mild hematuria.  2. Will obtain a CT of the abdomen and pelvis to rule out any obvious      pathology including a      renal etiology.  3. Precautions were reviewed with the patient.  She is to follow up      after CT results are known.     Leanne Chang, M.D.  Electronically Signed    LA/MedQ  DD: 11/03/2006  DT: 11/04/2006  Job #: [id number]

## 2011-12-30 ENCOUNTER — Other Ambulatory Visit: Payer: Self-pay | Admitting: Family Medicine

## 2011-12-30 MED ORDER — OMEPRAZOLE 40 MG PO CPDR: 40.0000 mg | DELAYED_RELEASE_CAPSULE | Freq: Every day | ORAL | Status: DC

## 2011-12-30 MED ORDER — VALACYCLOVIR HCL 1 G PO TABS: 1000.0000 mg | ORAL_TABLET | Freq: Every day | ORAL | Status: DC

## 2011-12-30 NOTE — Telephone Encounter (Signed)
rx sent to pharmacy by e-script  

## 2011-12-30 NOTE — Telephone Encounter (Signed)
Refill: Valtrex 1gm tab #30. Take 1 tablet by mouth every day. Last fill 10.26.11  Refill: Omeprazole 40mg  cap dr #30. Take 1 capsule by mouth daily. Last fill 12.5.12

## 2012-02-27 ENCOUNTER — Encounter: Payer: Self-pay | Admitting: Family Medicine

## 2012-02-27 ENCOUNTER — Other Ambulatory Visit (HOSPITAL_COMMUNITY)
Admission: RE | Admit: 2012-02-27 | Discharge: 2012-02-27 | Disposition: A | Discharge status: Home / Self Care | Payer: BC Managed Care – PPO | Source: Ambulatory Visit | Attending: Family Medicine | Admitting: Family Medicine

## 2012-02-27 ENCOUNTER — Ambulatory Visit (INDEPENDENT_AMBULATORY_CARE_PROVIDER_SITE_OTHER): Payer: BC Managed Care – PPO | Admitting: Family Medicine

## 2012-02-27 VITALS — BP 118/80 | HR 79 | Temp 98.4°F | Ht 66.0 in | Wt 166.6 lb

## 2012-02-27 DIAGNOSIS — Z124 Encounter for screening for malignant neoplasm of cervix: Secondary | ICD-10-CM | POA: Insufficient documentation

## 2012-02-27 DIAGNOSIS — Z0000 Encounter for general adult medical examination without abnormal findings: Secondary | ICD-10-CM | POA: Insufficient documentation

## 2012-02-27 DIAGNOSIS — Z01419 Encounter for gynecological examination (general) (routine) without abnormal findings: Secondary | ICD-10-CM | POA: Insufficient documentation

## 2012-02-27 DIAGNOSIS — Z1231 Encounter for screening mammogram for malignant neoplasm of breast: Secondary | ICD-10-CM

## 2012-02-27 DIAGNOSIS — Z780 Asymptomatic menopausal state: Secondary | ICD-10-CM

## 2012-02-27 LAB — HEPATIC FUNCTION PANEL
ALT: 23 U/L (ref 0–35)
AST: 25 U/L (ref 0–37)
Albumin: 4.1 g/dL (ref 3.5–5.2)
Bilirubin, Direct: 0 mg/dL (ref 0.0–0.3)
Total Bilirubin: 0.6 mg/dL (ref 0.3–1.2)

## 2012-02-27 LAB — BASIC METABOLIC PANEL
CO2: 23 mEq/L (ref 19–32)
GFR: 68.58 mL/min (ref >60.00)
Glucose, Bld: 78 mg/dL (ref 70–99)
Sodium: 138 mEq/L (ref 135–145)

## 2012-02-27 LAB — CBC WITH DIFFERENTIAL/PLATELET
Basophils Absolute: 0.1 10*3/uL (ref 0.0–0.1)
Eosinophils Relative: 4.6 % (ref 0.0–5.0)
Neutro Abs: 4.6 10*3/uL (ref 1.4–7.7)
Neutrophils Relative %: 60.1 % (ref 43.0–77.0)
Platelets: 292 10*3/uL (ref 150.0–400.0)
RBC: 4.65 Mil/uL (ref 3.87–5.11)
RDW: 13.1 % (ref 11.5–14.6)

## 2012-02-27 LAB — TSH: TSH: 3.82 u[IU]/mL (ref 0.35–5.50)

## 2012-02-27 LAB — LIPID PANEL: Triglycerides: 105 mg/dL (ref 0.0–149.0)

## 2012-02-27 LAB — LDL CHOLESTEROL, DIRECT: Direct LDL: 151.2 mg/dL

## 2012-02-27 NOTE — Assessment & Plan Note (Signed)
Pt's PE WNL.  Refer for mammo and DEXA.  UTD on colonoscopy.  Check labs.  Anticipatory guidance provided.  

## 2012-02-27 NOTE — Progress Notes (Signed)
  Subjective:    Patient ID: Danielle Daniel, female    DOB: 21-Sep-1951, 61 y.o.   MRN: [id number]  HPI CPE- overdue for mammo, has never had DEXA.  UTD on colonoscopy.   Review of Systems Patient reports no vision/ hearing changes, adenopathy,fever, weight change,  persistant/recurrent hoarseness , swallowing issues, chest pain, palpitations, edema, persistant/recurrent cough, hemoptysis, dyspnea (rest/exertional/paroxysmal nocturnal), gastrointestinal bleeding (melena, rectal bleeding), abdominal pain, bowel changes, GU symptoms (dysuria, hematuria, incontinence), Gyn symptoms (abnormal  bleeding, pain),  syncope, focal weakness, memory loss, numbness & tingling, skin/hair/nail changes, abnormal bruising or bleeding, anxiety, or depression.   +GERD    Objective:   Physical Exam  General Appearance:    Alert, cooperative, no distress, appears stated age  Head:    Normocephalic, without obvious abnormality, atraumatic  Eyes:    PERRL, conjunctiva/corneas clear, EOM's intact, fundi    benign, both eyes  Ears:    Normal TM's and external ear canals, both ears  Nose:   Nares normal, septum midline, mucosa normal, no drainage    or sinus tenderness  Throat:   Lips, mucosa, and tongue normal; teeth and gums normal  Neck:   Supple, symmetrical, trachea midline, no adenopathy;    Thyroid: no enlargement/tenderness/nodules  Back:     Symmetric, no curvature, ROM normal, no CVA tenderness  Lungs:     Clear to auscultation bilaterally, respirations unlabored  Chest Wall:    No tenderness or deformity   Heart:    Regular rate and rhythm, S1 and S2 normal, no murmur, rub   or gallop  Breast Exam:    No tenderness, masses, or nipple abnormality  Abdomen:     Soft, non-tender, bowel sounds active all four quadrants,    no masses, no organomegaly  Genitalia:    External genitalia normal, cervix normal in appearance, no CMT, uterus in normal size and position, adnexa w/out mass or tenderness, mucosa  pink and moist, no lesions or discharge present  Rectal:    Normal external appearance  Extremities:   Extremities normal, atraumatic, no cyanosis or edema  Pulses:   2+ and symmetric all extremities  Skin:   Skin color, texture, turgor normal, no rashes or lesions  Lymph nodes:   Cervical, supraclavicular, and axillary nodes normal  Neurologic:   CNII-XII intact, normal strength, sensation and reflexes    throughout          Assessment & Plan:

## 2012-02-27 NOTE — Patient Instructions (Signed)
We'll call you with your bone density and mammo appts We'll notify you of your lab results You look great!  Keep up the good work! Call with any questions or concerns Happy Early Birthday!

## 2012-02-27 NOTE — Assessment & Plan Note (Signed)
Pap collected. 

## 2012-03-02 ENCOUNTER — Encounter: Payer: Self-pay | Admitting: *Deleted

## 2012-03-02 LAB — VITAMIN D 1,25 DIHYDROXY
Vitamin D 1, 25 (OH)2 Total: 50 pg/mL (ref 18–72)
Vitamin D2 1, 25 (OH)2: <8 pg/mL
Vitamin D3 1, 25 (OH)2: 50 pg/mL

## 2012-03-04 ENCOUNTER — Telehealth: Payer: Self-pay | Admitting: Family Medicine

## 2012-03-04 DIAGNOSIS — E785 Hyperlipidemia, unspecified: Secondary | ICD-10-CM

## 2012-03-04 NOTE — Telephone Encounter (Signed)
Please advise if this pt needs a follow up OV or just labs, per noted scheduled labs only for cholesterol per did not want to start statin and use Niacin, Red Yeast Rice, Metamucil and diet and exercise first

## 2012-03-04 NOTE — Telephone Encounter (Signed)
Cholestoral re-ck 8.16.13 at 8am, can you put in lab orders please Thanks

## 2012-03-05 NOTE — Telephone Encounter (Signed)
FYI :Orders placed in chart for future fasting lab draw

## 2012-03-05 NOTE — Telephone Encounter (Signed)
Just lab visit. Fasting lipid panel, LFTs- dx 272.4

## 2012-03-19 ENCOUNTER — Ambulatory Visit
Admission: RE | Admit: 2012-03-19 | Discharge: 2012-03-19 | Disposition: A | Discharge status: Home / Self Care | Payer: BC Managed Care – PPO | Source: Ambulatory Visit | Attending: Family Medicine | Admitting: Family Medicine

## 2012-03-19 DIAGNOSIS — Z1231 Encounter for screening mammogram for malignant neoplasm of breast: Secondary | ICD-10-CM

## 2012-03-19 DIAGNOSIS — Z780 Asymptomatic menopausal state: Secondary | ICD-10-CM

## 2012-04-01 ENCOUNTER — Telehealth: Payer: Self-pay | Admitting: *Deleted

## 2012-04-01 NOTE — Telephone Encounter (Signed)
Mailed pt copy of results for breast imaging per noted on Normal dexa on may 31 13

## 2012-04-06 ENCOUNTER — Encounter: Payer: Self-pay | Admitting: Family Medicine

## 2012-06-04 ENCOUNTER — Other Ambulatory Visit (INDEPENDENT_AMBULATORY_CARE_PROVIDER_SITE_OTHER): Payer: BC Managed Care – PPO

## 2012-06-04 ENCOUNTER — Encounter: Payer: Self-pay | Admitting: Family Medicine

## 2012-06-04 DIAGNOSIS — E785 Hyperlipidemia, unspecified: Secondary | ICD-10-CM

## 2012-06-04 LAB — HEPATIC FUNCTION PANEL
ALT: 21 U/L (ref 0–35)
AST: 26 U/L (ref 0–37)
Bilirubin, Direct: 0 mg/dL (ref 0.0–0.3)
Total Bilirubin: 0.3 mg/dL (ref 0.3–1.2)

## 2012-06-04 LAB — LIPID PANEL: Cholesterol: 182 mg/dL (ref 0–200)

## 2012-06-04 NOTE — Progress Notes (Signed)
Labs only

## 2012-06-08 NOTE — Telephone Encounter (Signed)
Note pt response

## 2012-06-09 NOTE — Telephone Encounter (Signed)
Please note if not already read

## 2012-12-04 ENCOUNTER — Other Ambulatory Visit: Payer: Self-pay

## 2013-03-30 ENCOUNTER — Encounter: Payer: BC Managed Care – PPO | Admitting: Family Medicine

## 2013-04-26 ENCOUNTER — Other Ambulatory Visit: Payer: Self-pay

## 2013-04-26 ENCOUNTER — Other Ambulatory Visit: Payer: Self-pay | Admitting: Family Medicine

## 2013-04-26 DIAGNOSIS — Z1231 Encounter for screening mammogram for malignant neoplasm of breast: Secondary | ICD-10-CM

## 2013-04-27 ENCOUNTER — Other Ambulatory Visit: Payer: Self-pay | Admitting: Family Medicine

## 2013-05-11 ENCOUNTER — Ambulatory Visit
Admission: RE | Admit: 2013-05-11 | Discharge: 2013-05-11 | Disposition: A | Discharge status: Home / Self Care | Payer: BC Managed Care – PPO | Source: Ambulatory Visit

## 2013-05-11 DIAGNOSIS — Z1231 Encounter for screening mammogram for malignant neoplasm of breast: Secondary | ICD-10-CM

## 2013-05-19 ENCOUNTER — Encounter: Payer: Self-pay | Admitting: Family Medicine

## 2013-05-19 ENCOUNTER — Ambulatory Visit (INDEPENDENT_AMBULATORY_CARE_PROVIDER_SITE_OTHER): Payer: BC Managed Care – PPO | Admitting: Family Medicine

## 2013-05-19 VITALS — BP 120/60 | HR 64 | Temp 98.2°F | Ht 65.75 in | Wt 155.8 lb

## 2013-05-19 DIAGNOSIS — Z01419 Encounter for gynecological examination (general) (routine) without abnormal findings: Secondary | ICD-10-CM

## 2013-05-19 DIAGNOSIS — Z0000 Encounter for general adult medical examination without abnormal findings: Secondary | ICD-10-CM

## 2013-05-19 DIAGNOSIS — M62838 Other muscle spasm: Secondary | ICD-10-CM

## 2013-05-19 LAB — CBC WITH DIFFERENTIAL/PLATELET
Basophils Relative: 0.7 % (ref 0.0–3.0)
Hemoglobin: 14.6 g/dL (ref 12.0–15.0)
Lymphocytes Relative: 28.7 % (ref 12.0–46.0)
MCHC: 33.5 g/dL (ref 30.0–36.0)
Monocytes Relative: 6.1 % (ref 3.0–12.0)
Neutro Abs: 4.8 10*3/uL (ref 1.4–7.7)
RBC: 4.51 Mil/uL (ref 3.87–5.11)

## 2013-05-19 LAB — LDL CHOLESTEROL, DIRECT: Direct LDL: 149.3 mg/dL

## 2013-05-19 LAB — HEPATIC FUNCTION PANEL
ALT: 24 U/L (ref 0–35)
Total Protein: 7.6 g/dL (ref 6.0–8.3)

## 2013-05-19 LAB — BASIC METABOLIC PANEL
BUN: 10 mg/dL (ref 6–23)
CO2: 30 mEq/L (ref 19–32)
Chloride: 102 mEq/L (ref 96–112)
Creatinine, Ser: 0.9 mg/dL (ref 0.4–1.2)

## 2013-05-19 LAB — LIPID PANEL: Cholesterol: 228 mg/dL — ABNORMAL HIGH (ref 0–200)

## 2013-05-19 LAB — TSH: TSH: 3.32 u[IU]/mL (ref 0.35–5.50)

## 2013-05-19 MED ORDER — NAPROXEN 500 MG PO TABS: 500.0000 mg | ORAL_TABLET | Freq: Two times a day (BID) | ORAL | Status: AC

## 2013-05-19 MED ORDER — CYCLOBENZAPRINE HCL 10 MG PO TABS: 10.0000 mg | ORAL_TABLET | Freq: Three times a day (TID) | ORAL | Status: DC | PRN

## 2013-05-19 NOTE — Patient Instructions (Addendum)
Follow up in 1 year or as needed Start the Naproxen twice daily- take w/ food- for 7 days and then as needed Use the flexeril at night for muscle spasm HEAT! Keep up the good work!  You look great! We'll notify you of your lab results Call with any questions or concerns Happy Belated Birthday!

## 2013-05-19 NOTE — Assessment & Plan Note (Signed)
New.  Start scheduled NSAIDs, muscle relaxer prn.  Heat.  Reviewed supportive care and red flags that should prompt return.  Pt expressed understanding and is in agreement w/ plan.

## 2013-05-19 NOTE — Progress Notes (Signed)
  Subjective:    Patient ID: Danielle Daniel, female    DOB: [birth year], 62 y.o.   MRN: [id number]  HPI CPE- UTD on pap, mammo, colonoscopy.  Neck pain- L sided.  Muscular.  Improving since initially pulled 2 weeks ago.  Was using ben gay but developed rash on the skin.  No numbness/tingling.     Review of Systems Patient reports no vision/ hearing changes, adenopathy,fever, weight change,  persistant/recurrent hoarseness , swallowing issues, chest pain, palpitations, edema, persistant/recurrent cough, hemoptysis, dyspnea (rest/exertional/paroxysmal nocturnal), gastrointestinal bleeding (melena, rectal bleeding), abdominal pain, significant heartburn, bowel changes, GU symptoms (dysuria, hematuria, incontinence), Gyn symptoms (abnormal  bleeding, pain),  syncope, focal weakness, memory loss, numbness & tingling, skin/hair/nail changes, abnormal bruising or bleeding, anxiety, or depression.     Objective:   Physical Exam General Appearance:    Alert, cooperative, no distress, appears stated age  Head:    Normocephalic, without obvious abnormality, atraumatic  Eyes:    PERRL, conjunctiva/corneas clear, EOM's intact, fundi    benign, both eyes  Ears:    Normal TM's and external ear canals, both ears  Nose:   Nares normal, septum midline, mucosa normal, no drainage    or sinus tenderness  Throat:   Lips, mucosa, and tongue normal; teeth and gums normal  Neck:   Supple, symmetrical, trachea midline, no adenopathy;    Thyroid: no enlargement/tenderness/nodules  Back:     Symmetric, no curvature, ROM normal, no CVA tenderness  Lungs:     Clear to auscultation bilaterally, respirations unlabored  Chest Wall:    No tenderness or deformity   Heart:    Regular rate and rhythm, S1 and S2 normal, no murmur, rub   or gallop  Breast Exam:    Deferred to mammo  Abdomen:     Soft, non-tender, bowel sounds active all four quadrants,    no masses, no organomegaly  Genitalia:    Deferred  Rectal:     Extremities:   Extremities normal, atraumatic, no cyanosis or edema  Pulses:   2+ and symmetric all extremities  Skin:   Skin color, texture, turgor normal, no rashes or lesions  Lymph nodes:   Cervical, supraclavicular, and axillary nodes normal  Neurologic:   CNII-XII intact, normal strength, sensation and reflexes    throughout          Assessment & Plan:

## 2013-05-19 NOTE — Assessment & Plan Note (Signed)
Pt's PE WNL.  UTD on health maintenance.  Declines Zostavax.  Check labs.  Anticipatory guidance provided.

## 2013-05-22 ENCOUNTER — Encounter: Payer: Self-pay | Admitting: Family Medicine

## 2013-05-23 LAB — VITAMIN D 1,25 DIHYDROXY: Vitamin D2 1, 25 (OH)2: <8 pg/mL

## 2013-05-26 NOTE — Telephone Encounter (Signed)
Please advise.//AB/CMA 

## 2013-08-25 ENCOUNTER — Other Ambulatory Visit: Payer: Self-pay

## 2013-11-04 ENCOUNTER — Other Ambulatory Visit: Payer: Self-pay | Admitting: Family Medicine

## 2013-11-04 NOTE — Telephone Encounter (Signed)
Med filled.  

## 2014-03-06 ENCOUNTER — Telehealth: Payer: Self-pay | Admitting: Family Medicine

## 2014-03-06 NOTE — Telephone Encounter (Signed)
Caller name: [name]  Call back [phone]   Reason for call:  Pt states that she has allergies and wants to know what we reccommended as an OTC.  Pt has stuffy, congested nose, sneezing, sore throat from drainage.  Please contact to advise.

## 2014-03-06 NOTE — Telephone Encounter (Signed)
Pt notified of provider recommendations. 

## 2014-03-06 NOTE — Telephone Encounter (Signed)
Start Zyrtec OTC daily and add Flonase or Nasacort spray- 2 sprays each nostril daily

## 2014-03-31 ENCOUNTER — Telehealth: Payer: Self-pay | Admitting: Family Medicine

## 2014-03-31 NOTE — Telephone Encounter (Signed)
A user error has taken place.

## 2014-04-05 ENCOUNTER — Other Ambulatory Visit: Payer: Self-pay

## 2014-04-05 DIAGNOSIS — Z1231 Encounter for screening mammogram for malignant neoplasm of breast: Secondary | ICD-10-CM

## 2014-05-12 ENCOUNTER — Ambulatory Visit
Admission: RE | Admit: 2014-05-12 | Discharge: 2014-05-12 | Disposition: A | Discharge status: Home / Self Care | Payer: BC Managed Care – PPO | Source: Ambulatory Visit

## 2014-05-12 DIAGNOSIS — Z1231 Encounter for screening mammogram for malignant neoplasm of breast: Secondary | ICD-10-CM

## 2014-06-09 ENCOUNTER — Encounter: Payer: Self-pay | Admitting: Family Medicine

## 2014-06-09 ENCOUNTER — Ambulatory Visit (INDEPENDENT_AMBULATORY_CARE_PROVIDER_SITE_OTHER): Payer: BC Managed Care – PPO | Admitting: Family Medicine

## 2014-06-09 VITALS — BP 118/74 | HR 69 | Temp 98.2°F | Resp 16 | Ht 66.0 in | Wt 164.1 lb

## 2014-06-09 DIAGNOSIS — Z0000 Encounter for general adult medical examination without abnormal findings: Secondary | ICD-10-CM

## 2014-06-09 LAB — BASIC METABOLIC PANEL
BUN: 14 mg/dL (ref 6–23)
CALCIUM: 9.1 mg/dL (ref 8.4–10.5)
CHLORIDE: 104 meq/L (ref 96–112)
CO2: 29 mEq/L (ref 19–32)
CREATININE: 1 mg/dL (ref 0.4–1.2)
GFR: 63.13 mL/min (ref >60.00)
Glucose, Bld: 76 mg/dL (ref 70–99)
Potassium: 4 mEq/L (ref 3.5–5.1)
Sodium: 139 mEq/L (ref 135–145)

## 2014-06-09 LAB — CBC WITH DIFFERENTIAL/PLATELET
BASOS PCT: 0.7 % (ref 0.0–3.0)
Basophils Absolute: 0 10*3/uL (ref 0.0–0.1)
EOS PCT: 5.9 % — AB (ref 0.0–5.0)
Eosinophils Absolute: 0.4 10*3/uL (ref 0.0–0.7)
HEMATOCRIT: 42 % (ref 36.0–46.0)
HEMOGLOBIN: 14.1 g/dL (ref 12.0–15.0)
LYMPHS ABS: 2.3 10*3/uL (ref 0.7–4.0)
Lymphocytes Relative: 31.5 % (ref 12.0–46.0)
MCHC: 33.6 g/dL (ref 30.0–36.0)
MCV: 95.4 fl (ref 78.0–100.0)
MONO ABS: 0.5 10*3/uL (ref 0.1–1.0)
MONOS PCT: 6.8 % (ref 3.0–12.0)
Neutro Abs: 4 10*3/uL (ref 1.4–7.7)
Neutrophils Relative %: 55.1 % (ref 43.0–77.0)
PLATELETS: 333 10*3/uL (ref 150.0–400.0)
RBC: 4.4 Mil/uL (ref 3.87–5.11)
RDW: 13.1 % (ref 11.5–15.5)
WBC: 7.3 10*3/uL (ref 4.0–10.5)

## 2014-06-09 NOTE — Patient Instructions (Signed)
Follow up in 1 year or as needed We'll notify you of your lab results and make any changes if needed Keep up the good work!  You look great! Call with any questions or concerns Try and enjoy the rest of your summer!!!

## 2014-06-09 NOTE — Progress Notes (Signed)
Pre visit review using our clinic review tool, if applicable. No additional management support is needed unless otherwise documented below in the visit note. 

## 2014-06-09 NOTE — Assessment & Plan Note (Signed)
Pt's PE WNL.  UTD on health maintenance w/ exception of DEXA- pt wants to hold due to # of recent appts.  Check labs.  Anticipatory guidance provided.

## 2014-06-09 NOTE — Progress Notes (Signed)
   Subjective:    Patient ID: Danielle Daniel, female    DOB: 1951/05/03, 63 y.o.   MRN: [id number]  HPI CPE- UTD on colonoscopy, mammo, pap.  Pt prefers to hold on DEXA until next year due to multiple eye appts s/p recent surgery.   Review of Systems Patient reports no hearing changes, adenopathy,fever, weight change,  persistant/recurrent hoarseness , swallowing issues, chest pain, palpitations, edema, persistant/recurrent cough, hemoptysis, dyspnea (rest/exertional/paroxysmal nocturnal), gastrointestinal bleeding (melena, rectal bleeding), abdominal pain, significant heartburn, bowel changes, GU symptoms (dysuria, hematuria, incontinence), Gyn symptoms (abnormal  bleeding, pain),  syncope, focal weakness, memory loss, numbness & tingling, skin/hair/nail changes, abnormal bruising or bleeding, anxiety, or depression.     Objective:   Physical Exam General Appearance:    Alert, cooperative, no distress, appears stated age  Head:    Normocephalic, without obvious abnormality, atraumatic  Eyes:    PERRL, conjunctiva/corneas clear, EOM's intact, fundi    benign, both eyes  Ears:    Normal TM's and external ear canals, both ears  Nose:   Nares normal, septum midline, mucosa normal, no drainage    or sinus tenderness  Throat:   Lips, mucosa, and tongue normal; teeth and gums normal  Neck:   Supple, symmetrical, trachea midline, no adenopathy;    Thyroid: no enlargement/tenderness/nodules  Back:     Symmetric, no curvature, ROM normal, no CVA tenderness  Lungs:     Clear to auscultation bilaterally, respirations unlabored  Chest Wall:    No tenderness or deformity   Heart:    Regular rate and rhythm, S1 and S2 normal, no murmur, rub   or gallop  Breast Exam:    Deferred to mammo  Abdomen:     Soft, non-tender, bowel sounds active all four quadrants,    no masses, no organomegaly  Genitalia:    Deferred  Rectal:    Extremities:   Extremities normal, atraumatic, no cyanosis or edema    Pulses:   2+ and symmetric all extremities  Skin:   Skin color, texture, turgor normal, no rashes or lesions  Lymph nodes:   Cervical, supraclavicular, and axillary nodes normal  Neurologic:   CNII-XII intact, normal strength, sensation and reflexes    throughout          Assessment & Plan:

## 2014-06-10 LAB — LIPID PANEL
CHOL/HDL RATIO: 4
Cholesterol: 220 mg/dL — ABNORMAL HIGH (ref 0–200)
HDL: 53.6 mg/dL (ref >39.00)
LDL CALC: 142 mg/dL — AB (ref 0–99)
NONHDL: 166.4
TRIGLYCERIDES: 122 mg/dL (ref 0.0–149.0)
VLDL: 24.4 mg/dL (ref 0.0–40.0)

## 2014-06-10 LAB — HEPATIC FUNCTION PANEL
ALBUMIN: 4 g/dL (ref 3.5–5.2)
ALT: 24 U/L (ref 0–35)
AST: 29 U/L (ref 0–37)
Alkaline Phosphatase: 75 U/L (ref 39–117)
BILIRUBIN TOTAL: 0.7 mg/dL (ref 0.2–1.2)
Bilirubin, Direct: 0 mg/dL (ref 0.0–0.3)
Total Protein: 7.2 g/dL (ref 6.0–8.3)

## 2014-06-10 LAB — TSH: TSH: 2.42 u[IU]/mL (ref 0.35–4.50)

## 2014-06-10 LAB — VITAMIN D 25 HYDROXY (VIT D DEFICIENCY, FRACTURES): VITD: 40.07 ng/mL (ref 30.00–100.00)

## 2014-06-11 ENCOUNTER — Encounter: Payer: Self-pay | Admitting: Family Medicine

## 2014-06-18 ENCOUNTER — Other Ambulatory Visit: Payer: Self-pay | Admitting: Family Medicine

## 2014-06-19 NOTE — Telephone Encounter (Signed)
Med filled.  

## 2014-10-08 ENCOUNTER — Encounter: Payer: Self-pay | Admitting: Family Medicine

## 2014-10-10 ENCOUNTER — Encounter: Payer: Self-pay | Admitting: Medical

## 2014-10-10 ENCOUNTER — Ambulatory Visit (INDEPENDENT_AMBULATORY_CARE_PROVIDER_SITE_OTHER): Payer: BC Managed Care – PPO | Admitting: Medical

## 2014-10-10 VITALS — BP 139/76 | HR 82 | Temp 98.6°F | Ht 66.0 in | Wt 166.2 lb

## 2014-10-10 DIAGNOSIS — H6980 Other specified disorders of Eustachian tube, unspecified ear: Secondary | ICD-10-CM | POA: Insufficient documentation

## 2014-10-10 DIAGNOSIS — R591 Generalized enlarged lymph nodes: Secondary | ICD-10-CM | POA: Insufficient documentation

## 2014-10-10 DIAGNOSIS — H6982 Other specified disorders of Eustachian tube, left ear: Secondary | ICD-10-CM

## 2014-10-10 MED ORDER — CEPHALEXIN 500 MG PO CAPS: 500.0000 mg | ORAL_CAPSULE | Freq: Two times a day (BID) | ORAL | Status: DC

## 2014-10-10 NOTE — Progress Notes (Signed)
Pre visit review using our clinic review tool, if applicable. No additional management support is needed unless otherwise documented below in the visit note. 

## 2014-10-10 NOTE — Assessment & Plan Note (Signed)
For your mild enlarged left submandibular lymph node, I am  making keflex available if you were to  develop ear pain or regional type of sinus  infection symptoms. If the lymph node remains tender or swollen by 10 days then need a cbc done.

## 2014-10-10 NOTE — Progress Notes (Signed)
Subjective:    Patient ID: Danielle Daniel, female    DOB: 05-30-1951, 63 y.o.   MRN: [id number]  HPI   Pt in with lt ear pain. She states this has been coming and going since the spring. Pt states last spring thought was related to allergies. Pt never actually seen officially for this. This just started a couple of days ago. Pt feels a little congested presently. Pt does not remember in the past when had ear pressure sensation if she had nasal congestion.   No fevers, no chills, no itching eyes, no ha or dizziness.  Past Medical History  Diagnosis Date  . HSV (herpes simplex virus) infection   . GERD (gastroesophageal reflux disease)     History   Social History  . Marital Status: Single    Spouse Name: N/A    Number of Children: N/A  . Years of Education: N/A   Occupational History  . Not on file.   Social History Main Topics  . Smoking status: Never Smoker   . Smokeless tobacco: Not on file  . Alcohol Use: Yes     Comment: occasionally  . Drug Use: No  . Sexual Activity: Not on file   Other Topics Concern  . Not on file   Social History Narrative    Past Surgical History  Procedure Laterality Date  . Appendectomy    . Exploratory laparotomy    . Vitrectomy      Family History  Problem Relation Age of Onset  . Heart disease Sister     arrythmia  . Hypertension Sister   . Heart disease Sister     arrythmia  . Hypertension Sister   . Heart disease Mother     CHF    Allergies  Allergen Reactions  . Cortisone     Current Outpatient Prescriptions on File Prior to Visit  Medication Sig Dispense Refill  . Ascorbic Acid (VITAMIN C) 1000 MG tablet Take 1,000 mg by mouth daily.    . Calcium-Magnesium-Vitamin D 175-10-258 MG-MG-UNIT TB24 Take 1 tablet by mouth daily.    . Inositol Niacinate (NIACIN FLUSH FREE) 500 MG CAPS Take 1 capsule by mouth daily.    . Multiple Vitamin (MULTI-VITAMIN PO) Take 1 tablet by mouth daily.    Marland Kitchen omeprazole (PRILOSEC) 40  MG capsule Take 1 capsule (40 mg total) by mouth daily. 30 capsule 3  . POTASSIUM PO Take by mouth.    . Red Yeast Rice 600 MG CAPS Take 1 capsule by mouth daily.    . valACYclovir (VALTREX) 1000 MG tablet TAKE ONE TABLET BY MOUTH EVERY DAY 30 tablet 2   No current facility-administered medications on file prior to visit.    BP 139/76 mmHg  Pulse 82  Temp(Src) 98.6 F (37 C) (Oral)  Ht 5\' 6"  (1.676 m)  Wt 166 lb 3.2 oz (75.388 kg)  BMI 26.84 kg/m2  SpO2 97%          Review of Systems  Constitutional: Negative for fever, chills and fatigue.  HENT: Positive for congestion and ear pain. Negative for facial swelling, nosebleeds, sinus pressure, sore throat and tinnitus.        Lt ear pressure.  Respiratory: Negative for cough, choking, chest tightness, shortness of breath and wheezing.   Cardiovascular: Negative for chest pain and palpitations.  Musculoskeletal: Negative for neck pain.  Neurological: Negative for dizziness, tremors, seizures, weakness, light-headedness and headaches.  Hematological: Positive for adenopathy.  Faint mild lt submandibular.       Objective:   Physical Exam   General  Mental Status - Alert. General Appearance - Well groomed. Not in acute distress.  Skin Rashes- No Rashes.  HEENT Head- Normal. Ear Auditory Canal - Left- Normal. Right - Normal.Tympanic Membrane- Left- Normal. Right- Normal. Eye Sclera/Conjunctiva- Left- Normal. Right- Normal. Nose & Sinuses Nasal Mucosa- Left-   boggy + Congested. Right-  boggy + Congested. Mouth & Throat Lips: Upper Lip- Normal: no dryness, cracking, pallor, cyanosis, or vesicular eruption. Lower Lip-Normal: no dryness, cracking, pallor, cyanosis or vesicular eruption. Buccal Mucosa- Bilateral- No Aphthous ulcers. Oropharynx- No Discharge or Erythema. Tonsils: Characteristics- Bilateral- No Erythema or Congestion. Size/Enlargement- Bilateral- No enlargement. Discharge-  bilateral-None.  Neck Neck- Supple. No Masses. Faint mild enlarged left submandibular node and faint tender   Chest and Lung Exam Auscultation: Breath Sounds:- even and unlabored.  Cardiovascular Auscultation:Rythm- Regular, rate and rhythm. Murmurs & Other Heart Sounds:Ausculatation of the heart reveal- No Murmurs.  Lymphatic Head & Neck General Head & Neck Lymphatics: Bilateral: Description- No Localized lymphadenopathy.  CN III- XII grossly intact.         Assessment & Plan:

## 2014-10-10 NOTE — Patient Instructions (Addendum)
Your likely have eustachian tube dysfunction related to nasal congestion from possible early uri. Since you have reported allergy to cortisone then advise use afrin only for 2 days and then saline nasal spray.   For your mild enlarged left submandibular lymph node, I am  making keflex available if you were to  develop ear pain or regional type of sinus  infection symptoms. If the lymph node remains tender or swollen by 10 days then need a cbc done.  If any ear pressure with other symptom such ha, or dizziness then may need other diagnostic studies.  Follow up in 10 days or as needed.

## 2014-10-10 NOTE — Assessment & Plan Note (Signed)
Your likely have eustachian tube dysfunction related to nasal congestion from possible early uri. Since you have reported allergy to cortisone then advise use afrin only for 2 days and then saline nasal spray.

## 2014-11-03 ENCOUNTER — Encounter: Payer: Self-pay | Admitting: Family Medicine

## 2014-11-03 DIAGNOSIS — K219 Gastro-esophageal reflux disease without esophagitis: Secondary | ICD-10-CM

## 2014-11-03 NOTE — Telephone Encounter (Signed)
Referral placed.

## 2014-11-07 ENCOUNTER — Encounter: Payer: Self-pay | Admitting: Family Medicine

## 2014-11-08 ENCOUNTER — Ambulatory Visit (INDEPENDENT_AMBULATORY_CARE_PROVIDER_SITE_OTHER): Payer: BLUE CROSS/BLUE SHIELD | Admitting: Family Medicine

## 2014-11-08 ENCOUNTER — Encounter: Payer: Self-pay | Admitting: Family Medicine

## 2014-11-08 VITALS — BP 122/80 | HR 89 | Temp 98.3°F | Resp 16 | Wt 162.2 lb

## 2014-11-08 DIAGNOSIS — H6982 Other specified disorders of Eustachian tube, left ear: Secondary | ICD-10-CM

## 2014-11-08 MED ORDER — AZELASTINE HCL 0.1 % NA SOLN: 2.0000 | Freq: Two times a day (BID) | NASAL | Status: DC

## 2014-11-08 MED ORDER — CETIRIZINE HCL 10 MG PO TABS: 10.0000 mg | ORAL_TABLET | Freq: Every day | ORAL | Status: DC

## 2014-11-08 NOTE — Progress Notes (Signed)
   Subjective:    Patient ID: Danielle Daniel, female    DOB: 16-Nov-1950, 64 y.o.   MRN: [id number]  HPI L ear pain- saw PA on 12/22 and dx'd w/ Eustachian Tube Dysfunction.  Pt used Afrin x2 days w/o improvement.  Did not use claritin/zyrtec or flonase- 'he didn't tell me to'.  Pt has script of keflex available but wasn't sure if she should take it.  'i have lots of phlegm'.  Some sore throat on L side.  No facial pain/pressure.  Denies nasal congestion.  No fevers.   Review of Systems For ROS see HPI     Objective:   Physical Exam  Constitutional: She appears well-developed and well-nourished. No distress.  HENT:  Head: Normocephalic and atraumatic.  Right Ear: Tympanic membrane normal.  Left Ear: Tympanic membrane is retracted.  Nose: Mucosal edema and rhinorrhea present. Right sinus exhibits no maxillary sinus tenderness and no frontal sinus tenderness. Left sinus exhibits no maxillary sinus tenderness and no frontal sinus tenderness.  Mouth/Throat: Mucous membranes are normal. Posterior oropharyngeal erythema (w/ PND) present.  Eyes: Conjunctivae and EOM are normal. Pupils are equal, round, and reactive to light.  Neck: Normal range of motion. Neck supple.  Cardiovascular: Normal rate, regular rhythm and normal heart sounds.   Pulmonary/Chest: Effort normal and breath sounds normal. No respiratory distress. She has no wheezes. She has no rales.  Lymphadenopathy:    She has no cervical adenopathy.  Vitals reviewed.         Assessment & Plan:

## 2014-11-08 NOTE — Patient Instructions (Signed)
Follow up via phone or MyChart if no improvement in 10-14 days Drink plenty of fluids Start daily Zyrtec to decrease pressure and congestion Use the Astelin nasal spray (this is not a steroid since you have history of allergy but will work similarly) as directed Call with any questions or concerns Hang in there!!!

## 2014-11-08 NOTE — Progress Notes (Signed)
Pre visit review using our clinic review tool, if applicable. No additional management support is needed unless otherwise documented below in the visit note. 

## 2014-11-08 NOTE — Assessment & Plan Note (Signed)
New to provider.  Agree w/ PAs dx.  Pt was unable to tolerate Afrin and has hx of cortisone allergy.  Based on this, will start Astelin nasal spray to improve symptoms.  Pt to also start Zyrtec daily.  Drink plenty of fluids.  If no improvement, due to cortisone allergy, will need ENT referral.  Pt expressed understanding and is in agreement w/ plan.

## 2014-11-09 ENCOUNTER — Ambulatory Visit: Payer: Self-pay | Admitting: Gastroenterology

## 2015-04-18 ENCOUNTER — Other Ambulatory Visit: Payer: Self-pay

## 2015-04-18 DIAGNOSIS — Z1231 Encounter for screening mammogram for malignant neoplasm of breast: Secondary | ICD-10-CM

## 2015-05-18 ENCOUNTER — Ambulatory Visit
Admission: RE | Admit: 2015-05-18 | Discharge: 2015-05-18 | Disposition: A | Discharge status: Home / Self Care | Payer: BLUE CROSS/BLUE SHIELD | Source: Ambulatory Visit

## 2015-05-18 DIAGNOSIS — Z1231 Encounter for screening mammogram for malignant neoplasm of breast: Secondary | ICD-10-CM

## 2015-08-24 ENCOUNTER — Encounter: Payer: Self-pay | Admitting: Behavioral Health

## 2015-08-24 ENCOUNTER — Telehealth: Payer: Self-pay | Admitting: Behavioral Health

## 2015-08-24 NOTE — Telephone Encounter (Signed)
Pre-Visit Call completed with patient and chart updated.   Pre-Visit Info documented in Specialty Comments under [employer].    

## 2015-08-27 ENCOUNTER — Encounter: Payer: Self-pay | Admitting: Family Medicine

## 2015-08-27 ENCOUNTER — Ambulatory Visit (INDEPENDENT_AMBULATORY_CARE_PROVIDER_SITE_OTHER): Payer: BLUE CROSS/BLUE SHIELD | Admitting: Family Medicine

## 2015-08-27 ENCOUNTER — Ambulatory Visit (HOSPITAL_BASED_OUTPATIENT_CLINIC_OR_DEPARTMENT_OTHER)
Admission: RE | Admit: 2015-08-27 | Discharge: 2015-08-27 | Disposition: A | Discharge status: Home / Self Care | Payer: BLUE CROSS/BLUE SHIELD | Source: Ambulatory Visit | Attending: Family Medicine | Admitting: Family Medicine

## 2015-08-27 ENCOUNTER — Other Ambulatory Visit (HOSPITAL_COMMUNITY)
Admission: RE | Admit: 2015-08-27 | Discharge: 2015-08-27 | Disposition: A | Discharge status: Home / Self Care | Payer: BLUE CROSS/BLUE SHIELD | Source: Ambulatory Visit | Attending: Family Medicine | Admitting: Family Medicine

## 2015-08-27 VITALS — BP 120/80 | HR 77 | Temp 98.0°F | Resp 16 | Ht 66.0 in | Wt 158.2 lb

## 2015-08-27 DIAGNOSIS — Z1211 Encounter for screening for malignant neoplasm of colon: Secondary | ICD-10-CM

## 2015-08-27 DIAGNOSIS — Z01419 Encounter for gynecological examination (general) (routine) without abnormal findings: Secondary | ICD-10-CM | POA: Insufficient documentation

## 2015-08-27 DIAGNOSIS — Z780 Asymptomatic menopausal state: Secondary | ICD-10-CM | POA: Diagnosis present

## 2015-08-27 DIAGNOSIS — Z124 Encounter for screening for malignant neoplasm of cervix: Secondary | ICD-10-CM

## 2015-08-27 DIAGNOSIS — Z1151 Encounter for screening for human papillomavirus (HPV): Secondary | ICD-10-CM | POA: Insufficient documentation

## 2015-08-27 DIAGNOSIS — Z0000 Encounter for general adult medical examination without abnormal findings: Secondary | ICD-10-CM | POA: Diagnosis not present

## 2015-08-27 LAB — HEPATIC FUNCTION PANEL
ALBUMIN: 4.2 g/dL (ref 3.5–5.2)
ALT: 20 U/L (ref 0–35)
AST: 24 U/L (ref 0–37)
Alkaline Phosphatase: 79 U/L (ref 39–117)
Bilirubin, Direct: 0.1 mg/dL (ref 0.0–0.3)
Total Bilirubin: 0.5 mg/dL (ref 0.2–1.2)
Total Protein: 7.2 g/dL (ref 6.0–8.3)

## 2015-08-27 LAB — CBC WITH DIFFERENTIAL/PLATELET
Basophils Absolute: 0.1 10*3/uL (ref 0.0–0.1)
Basophils Relative: 0.6 % (ref 0.0–3.0)
EOS PCT: 3.4 % (ref 0.0–5.0)
Eosinophils Absolute: 0.3 10*3/uL (ref 0.0–0.7)
HCT: 42.6 % (ref 36.0–46.0)
HEMOGLOBIN: 14.3 g/dL (ref 12.0–15.0)
Lymphocytes Relative: 28 % (ref 12.0–46.0)
Lymphs Abs: 2.3 10*3/uL (ref 0.7–4.0)
MCHC: 33.4 g/dL (ref 30.0–36.0)
MCV: 95.9 fl (ref 78.0–100.0)
MONOS PCT: 6.4 % (ref 3.0–12.0)
Monocytes Absolute: 0.5 10*3/uL (ref 0.1–1.0)
NEUTROS PCT: 61.6 % (ref 43.0–77.0)
Neutro Abs: 5.1 10*3/uL (ref 1.4–7.7)
Platelets: 331 10*3/uL (ref 150.0–400.0)
RBC: 4.45 Mil/uL (ref 3.87–5.11)
RDW: 13.1 % (ref 11.5–15.5)
WBC: 8.2 10*3/uL (ref 4.0–10.5)

## 2015-08-27 LAB — BASIC METABOLIC PANEL
BUN: 15 mg/dL (ref 6–23)
CALCIUM: 10 mg/dL (ref 8.4–10.5)
CO2: 29 mEq/L (ref 19–32)
CREATININE: 0.91 mg/dL (ref 0.40–1.20)
Chloride: 102 mEq/L (ref 96–112)
GFR: 66.09 mL/min (ref >60.00)
Glucose, Bld: 93 mg/dL (ref 70–99)
Potassium: 4.2 mEq/L (ref 3.5–5.1)
Sodium: 139 mEq/L (ref 135–145)

## 2015-08-27 LAB — LIPID PANEL
CHOL/HDL RATIO: 4
Cholesterol: 202 mg/dL — ABNORMAL HIGH (ref 0–200)
HDL: 55.4 mg/dL (ref >39.00)
LDL CALC: 128 mg/dL — AB (ref 0–99)
NonHDL: 146.94
TRIGLYCERIDES: 97 mg/dL (ref 0.0–149.0)
VLDL: 19.4 mg/dL (ref 0.0–40.0)

## 2015-08-27 LAB — VITAMIN D 25 HYDROXY (VIT D DEFICIENCY, FRACTURES): VITD: 39.63 ng/mL (ref 30.00–100.00)

## 2015-08-27 LAB — TSH: TSH: 3.93 u[IU]/mL (ref 0.35–4.50)

## 2015-08-27 NOTE — Progress Notes (Signed)
Pre visit review using our clinic review tool, if applicable. No additional management support is needed unless otherwise documented below in the visit note. 

## 2015-08-27 NOTE — Progress Notes (Signed)
   Subjective:    Patient ID: Danielle Daniel, female    DOB: Apr 01, 1951, 64 y.o.   MRN: [id number]  HPI CPE- UTD on colonoscopy (due Feb 2017 at [facility]).  UTD on mammo.  Due for pap, DEXA.   Review of Systems Patient reports no vision/ hearing changes, adenopathy,fever, weight change,  persistant/recurrent hoarseness , swallowing issues, chest pain, palpitations, edema, persistant/recurrent cough, hemoptysis, dyspnea (rest/exertional/paroxysmal nocturnal), gastrointestinal bleeding (melena, rectal bleeding), abdominal pain, significant heartburn, bowel changes, GU symptoms (dysuria, hematuria, incontinence), Gyn symptoms (abnormal  bleeding, pain),  syncope, focal weakness, memory loss, numbness & tingling, skin/hair/nail changes, abnormal bruising or bleeding, anxiety, or depression.     Objective:   Physical Exam  General Appearance:    Alert, cooperative, no distress, appears stated age  Head:    Normocephalic, without obvious abnormality, atraumatic  Eyes:    PERRL, conjunctiva/corneas clear, EOM's intact, fundi    benign, both eyes  Ears:    Normal TM's and external ear canals, both ears  Nose:   Nares normal, septum midline, mucosa normal, no drainage    or sinus tenderness  Throat:   Lips, mucosa, and tongue normal; teeth and gums normal  Neck:   Supple, symmetrical, trachea midline, no adenopathy;    Thyroid: no enlargement/tenderness/nodules  Back:     Symmetric, no curvature, ROM normal, no CVA tenderness  Lungs:     Clear to auscultation bilaterally, respirations unlabored  Chest Wall:    No tenderness or deformity   Heart:    Regular rate and rhythm, S1 and S2 normal, no murmur, rub   or gallop  Breast Exam:    No tenderness, masses, or nipple abnormality  Abdomen:     Soft, non-tender, bowel sounds active all four quadrants,    no masses, no organomegaly  Genitalia:    External genitalia normal, cervix normal in appearance, no CMT, uterus in normal size and position,  adnexa w/out mass or tenderness, mucosa pink and moist, no lesions or discharge present  Rectal:    Normal external appearance  Extremities:   Extremities normal, atraumatic, no cyanosis or edema  Pulses:   2+ and symmetric all extremities  Skin:   Skin color, texture, turgor normal, no rashes or lesions  Lymph nodes:   Cervical, supraclavicular, and axillary nodes normal  Neurologic:   CNII-XII intact, normal strength, sensation and reflexes    throughout          Assessment & Plan:

## 2015-08-27 NOTE — Assessment & Plan Note (Signed)
Pap collected. 

## 2015-08-27 NOTE — Assessment & Plan Note (Addendum)
Pt's PE WNL.  UTD on mammogram, due for colonoscopy in Feb- order entered.  Pap collected today.  Bone density ordered as part of routine preventative care for postmenopausal women.  Check labs.  Anticipatory guidance provided.

## 2015-08-27 NOTE — Patient Instructions (Signed)
Follow up in 1 year or as needed (with your new doctor) We'll notify you of your lab results and make any changes if needed Continue to work on healthy diet and regular exercise- you look great! We'll call you with your GI appt to discuss colonoscopy and your bone density appt (downstairs) Call with any questions or concerns Happy Holidays!

## 2015-08-27 NOTE — Addendum Note (Signed)
Addended by: Davis Gourd on: 08/27/2015 08:36 AM   Modules accepted: Orders, Medications

## 2015-08-29 LAB — CYTOLOGY - PAP

## 2015-09-04 ENCOUNTER — Encounter: Payer: Self-pay | Admitting: Family Medicine

## 2015-09-25 ENCOUNTER — Telehealth: Payer: Self-pay

## 2015-09-25 NOTE — Telephone Encounter (Signed)
Yes- this was done as a preventative screening test due to her postmenopausal status

## 2015-09-25 NOTE — Telephone Encounter (Signed)
Patient called upset that she received a bill for her bone density done on 08/27/15. States that she called billing and they told her to contact use to have the Provider resubmit the claim as a prevented test instead of medical procedure. Said that her insurance is rejecting this claim because of the way it is coded. Per patient per billing they will not touch it until the provider notify's them that this is a preventive procedure.

## 2015-10-02 ENCOUNTER — Encounter: Payer: Self-pay | Admitting: Family Medicine

## 2015-11-22 ENCOUNTER — Telehealth: Payer: Self-pay

## 2015-11-22 ENCOUNTER — Encounter: Payer: Self-pay | Admitting: Family Medicine

## 2015-11-22 NOTE — Telephone Encounter (Signed)
Patient called in to say on her recent Bone Density Exam it was coded Esrtogen deficiency and her insurance would not pay for it. Says they are calling it Medical rather than Preventive. She states the last one was coded Post Menopausal and it was paid for. SHe states the billing department told her billing codes have changed since she had  done last.

## 2015-11-22 NOTE — Telephone Encounter (Signed)
I addended the bone density order to include postmenopausal state (but this code seems to be the same as post-menopausal estrogen deficiency.  I have no way to change the DEXA I ordered, but this was a screening/preventative test for pt.  She had her DEXA done downstairs at [place] and the billing issue is with this particular facility.  Maybe radiology could help [place] get this covered for her.

## 2015-11-28 ENCOUNTER — Encounter: Payer: Self-pay | Admitting: Family Medicine

## 2015-11-29 NOTE — Telephone Encounter (Signed)
Talked to patient this morning. I held a conference call with billing and the patient. Patient was informed by billing that the coding was coded correct and indeed coded using a preventative code. I was instructed by billing to send addendum to medical records for them to review the records and make sure to coding is correct. I faxed the records to medical record based on the number provided by [facility] [phone] once I hear back I will reach out to patient to inform her of the outcome

## 2015-12-31 ENCOUNTER — Telehealth: Payer: Self-pay | Admitting: Family Medicine

## 2015-12-31 NOTE — Telephone Encounter (Signed)
error:[id number] ° °

## 2015-12-31 NOTE — Telephone Encounter (Signed)
I talked called patient after receiving  Voicemail from patient regarding the bill we have been working on for a couple of months. Billing has stated that the charges are correct even after the addendum.  Patient informed me she is now working with a health advocate to get this bill resolved she said I may be hearing from her in the future.

## 2016-03-10 ENCOUNTER — Encounter: Payer: Self-pay | Admitting: Family Medicine

## 2016-04-19 HISTORY — PX: CATARACT EXTRACTION: SUR2

## 2016-05-23 ENCOUNTER — Other Ambulatory Visit: Payer: Self-pay | Admitting: Family Medicine

## 2016-05-23 DIAGNOSIS — Z1231 Encounter for screening mammogram for malignant neoplasm of breast: Secondary | ICD-10-CM

## 2016-05-30 ENCOUNTER — Ambulatory Visit
Admission: RE | Admit: 2016-05-30 | Discharge: 2016-05-30 | Disposition: A | Discharge status: Home / Self Care | Payer: BLUE CROSS/BLUE SHIELD | Source: Ambulatory Visit | Attending: Family Medicine | Admitting: Family Medicine

## 2016-05-30 DIAGNOSIS — Z1231 Encounter for screening mammogram for malignant neoplasm of breast: Secondary | ICD-10-CM | POA: Diagnosis not present

## 2016-06-17 ENCOUNTER — Telehealth: Payer: Self-pay | Admitting: Family Medicine

## 2016-06-17 DIAGNOSIS — Z1211 Encounter for screening for malignant neoplasm of colon: Secondary | ICD-10-CM

## 2016-06-17 NOTE — Telephone Encounter (Signed)
Referral placed. This was originally placed in November and per referral notes GI was going to call and schedule once that schedule opened up.

## 2016-06-17 NOTE — Telephone Encounter (Signed)
Pt asking for a referral to have her colonoscopy done and states she is past due

## 2016-06-18 ENCOUNTER — Encounter: Payer: Self-pay | Admitting: Gastroenterology

## 2016-08-13 ENCOUNTER — Ambulatory Visit: Payer: PPO | Admitting: *Deleted

## 2016-08-13 VITALS — Ht 65.0 in | Wt 163.6 lb

## 2016-08-13 DIAGNOSIS — Z1211 Encounter for screening for malignant neoplasm of colon: Secondary | ICD-10-CM

## 2016-08-13 MED ORDER — SUPREP BOWEL PREP KIT 17.5-3.13-1.6 GM/177ML PO SOLN: 1.0000 | Freq: Once | ORAL | 0 refills | Status: AC

## 2016-08-13 NOTE — Progress Notes (Signed)
Patient denies any allergies to egg or soy products. Patient denies complications with anesthesia/sedation.  Patient denies oxygen use at home and denies diet medications. Emmi instructions for colonoscopy explained but patient denied.  Pamphlet given.  

## 2016-08-15 ENCOUNTER — Encounter: Payer: Self-pay | Admitting: Gastroenterology

## 2016-08-20 ENCOUNTER — Telehealth: Payer: Self-pay | Admitting: Gastroenterology

## 2016-08-21 ENCOUNTER — Telehealth: Payer: Self-pay

## 2016-08-21 NOTE — Telephone Encounter (Signed)
Left message with patient that I would leave a Suprep sample up front for her to pick up.

## 2016-08-21 NOTE — Telephone Encounter (Signed)
Magda Paganini, can you add her to your list  Thanks, Lelan Pons [facility]

## 2016-08-21 NOTE — Telephone Encounter (Signed)
Procedure is 11-8 and pt states she cannot do 85$ for her prep.  Instructed to get sample from 4th floor desk. Pt states she cannot do 50 $ as well with the pay no more coupon. Will do sample  Medication Samples have been provided to the patient.  Drug name: suprep            Qty: 1  LOTDR:[id number]  Exp.Date: 9-19  Dosing instructions: as directed in PV   The patient has been instructed regarding the correct time, dose, and frequency of taking this medication, including desired effects and most common side effects.   Brock Bad 9:57 AM 08/21/2016

## 2016-08-27 ENCOUNTER — Encounter: Payer: Self-pay | Admitting: Gastroenterology

## 2016-08-27 ENCOUNTER — Ambulatory Visit (AMBULATORY_SURGERY_CENTER): Payer: PPO | Admitting: Gastroenterology

## 2016-08-27 VITALS — BP 136/79 | HR 68 | Temp 97.5°F | Resp 12 | Ht 65.0 in | Wt 163.0 lb

## 2016-08-27 DIAGNOSIS — Z1211 Encounter for screening for malignant neoplasm of colon: Secondary | ICD-10-CM

## 2016-08-27 DIAGNOSIS — D122 Benign neoplasm of ascending colon: Secondary | ICD-10-CM | POA: Diagnosis not present

## 2016-08-27 DIAGNOSIS — Z1212 Encounter for screening for malignant neoplasm of rectum: Secondary | ICD-10-CM | POA: Diagnosis not present

## 2016-08-27 DIAGNOSIS — D123 Benign neoplasm of transverse colon: Secondary | ICD-10-CM

## 2016-08-27 MED ORDER — SODIUM CHLORIDE 0.9 % IV SOLN: 500.0000 mL | INTRAVENOUS | Status: DC

## 2016-08-27 NOTE — Op Note (Addendum)
[facility] Patient Name: Danielle Daniel Procedure Date: 08/27/2016 9:51 AM MRN: [id number] Endoscopist: Remo Lipps P. Armbruster MD, MD Age: 65 Referring MD:  Date of Birth: Jul 28, 1951 Gender: Female Account #: [id number] Procedure:                Colonoscopy Indications:              Screening for malignant neoplasm in the colon Medicines:                Monitored Anesthesia Care Procedure:                Pre-Anesthesia Assessment:                           - Prior to the procedure, a History and Physical                            was performed, and patient medications and                            allergies were reviewed. The patient's tolerance of                            previous anesthesia was also reviewed. The risks                            and benefits of the procedure and the sedation                            options and risks were discussed with the patient.                            All questions were answered, and informed consent                            was obtained. Prior Anticoagulants: The patient has                            taken no previous anticoagulant or antiplatelet                            agents. ASA Grade Assessment: II - A patient with                            mild systemic disease. After reviewing the risks                            and benefits, the patient was deemed in                            satisfactory condition to undergo the procedure.                           After obtaining informed consent, the colonoscope  was passed under direct vision. Throughout the                            procedure, the patient's blood pressure, pulse, and                            oxygen saturations were monitored continuously. The                            Model PCF-H190L [id number]) scope was introduced                            through the anus and advanced to the the cecum,   identified by appendiceal orifice and ileocecal                            valve. The colonoscopy was performed without                            difficulty. The patient tolerated the procedure                            well. The quality of the bowel preparation was                            good. The ileocecal valve, appendiceal orifice, and                            rectum were photographed. Scope In: 9:56:13 AM Scope Out: 10:19:01 AM Scope Withdrawal Time: 0 hours 14 minutes 53 seconds  Total Procedure Duration: 0 hours 22 minutes 48 seconds  Findings:                 The perianal and digital rectal examinations were                            normal.                           A 6 mm polyp was found in the ascending colon. The                            polyp was sessile. The polyp was removed with a                            cold snare. Resection and retrieval were complete.                           A 5 mm polyp was found in the hepatic flexure. The                            polyp was sessile. The polyp was removed with a  cold snare. Resection and retrieval were complete.                           The colon was tortuous with significant looping.                           Anal papilla(e) were hypertrophied.                           The exam was otherwise without abnormality.                           ADDENDUM: After the procedure was completed the                            patient was not able to pass any air, had a                            distended abdomen and remained unfortable. We                            brought her back to the procedure room, placed the                            endoscope and reached the cecum. There was a very                            angulated turn at the hepatic flexure with retained                            air in the right colon. The entire colon was                            suctioned of air and abdominal  distension resolved, Complications:            No immediate complications. Estimated blood loss:                            Minimal. Estimated Blood Loss:     Estimated blood loss was minimal. Impression:               - One 6 mm polyp in the ascending colon, removed                            with a cold snare. Resected and retrieved.                           - One 5 mm polyp at the hepatic flexure, removed                            with a cold snare. Resected and retrieved.                           - Tortuous colon with  significant looping.                           - Anal papilla(e) were hypertrophied.                           - The examination was otherwise normal.                           - Retained air post procedure, led to placement of                            endoscope again to removed retained air. Recommendation:           - Patient has a contact number available for                            emergencies. The signs and symptoms of potential                            delayed complications were discussed with the                            patient. Return to normal activities tomorrow.                            Written discharge instructions were provided to the                            patient.                           - Resume previous diet.                           - Continue present medications.                           - No ibuprofen, naproxen, or other non-steroidal                            anti-inflammatory drugs for 2 weeks after polyp                            removal.                           - Await pathology results.                           - Repeat colonoscopy is recommended for                            surveillance. The colonoscopy date will be                            determined after pathology results from today's  exam become available for review. Remo Lipps P. Armbruster MD, MD 08/27/2016 10:23:54 AM This report  has been signed electronically.

## 2016-08-27 NOTE — Progress Notes (Signed)
A/ox3, pleased with MAC, report to RN A/ox3, pleased with MAC, report to RN 

## 2016-08-27 NOTE — Progress Notes (Signed)
Pt has not passed any flatus.  Pt has been rolled right lateral.  Pt was on her hands and knees rocking forward and back with out relief.  Levsin sub linguially at 10:50.  Pt was taken off the monitor and Randall Hiss, RN walked beside pt in the recovery room.  Pt then was taken to the restroom to sit on the toilet.  maw

## 2016-08-27 NOTE — Patient Instructions (Signed)
YOU HAD AN ENDOSCOPIC PROCEDURE TODAY AT [facility]:   Refer to the procedure report that was given to you for any specific questions about what was found during the examination.  If the procedure report does not answer your questions, please call your gastroenterologist to clarify.  If you requested that your care partner not be given the details of your procedure findings, then the procedure report has been included in a sealed envelope for you to review at your convenience later.  YOU SHOULD EXPECT: Some feelings of bloating in the abdomen. Passage of more gas than usual.  Walking can help get rid of the air that was put into your GI tract during the procedure and reduce the bloating. If you had a lower endoscopy (such as a colonoscopy or flexible sigmoidoscopy) you may notice spotting of blood in your stool or on the toilet paper. If you underwent a bowel prep for your procedure, you may not have a normal bowel movement for a few days.  Please Note:  You might notice some irritation and congestion in your nose or some drainage.  This is from the oxygen used during your procedure.  There is no need for concern and it should clear up in a day or so.  SYMPTOMS TO REPORT IMMEDIATELY:   Following lower endoscopy (colonoscopy or flexible sigmoidoscopy):  Excessive amounts of blood in the stool  Significant tenderness or worsening of abdominal pains  Swelling of the abdomen that is new, acute  Fever of 100F or higher   Following upper endoscopy (EGD)  Vomiting of blood or coffee ground material  New chest pain or pain under the shoulder blades  Painful or persistently difficult swallowing  New shortness of breath  Fever of 100F or higher  Black, tarry-looking stools  For urgent or emergent issues, a gastroenterologist can be reached at any hour by calling [phone].   DIET:  We do recommend a small meal at first, but then you may proceed to your regular diet.  Drink  plenty of fluids but you should avoid alcoholic beverages for 24 hours.  ACTIVITY:  You should plan to take it easy for the rest of today and you should NOT DRIVE or use heavy machinery until tomorrow (because of the sedation medicines used during the test).    FOLLOW UP: Our staff will call the number listed on your records the next business day following your procedure to check on you and address any questions or concerns that you may have regarding the information given to you following your procedure. If we do not reach you, we will leave a message.  However, if you are feeling well and you are not experiencing any problems, there is no need to return our call.  We will assume that you have returned to your regular daily activities without incident.  If any biopsies were taken you will be contacted by phone or by letter within the next 1-3 weeks.  Please call us at [phone] if you have not heard about the biopsies in 3 weeks.    SIGNATURES/CONFIDENTIALITY: You and/or your care partner have signed paperwork which will be entered into your electronic medical record.  These signatures attest to the fact that that the information above on your After Visit Summary has been reviewed and is understood.  Full responsibility of the confidentiality of this discharge information lies with you and/or your care-partner.    Handout was given to your care partner on polyps.  aspirin, aspirin products,  ibuprofen, naproxen, advil, motrin, aleve, or other non-steroidal anti-inflammatory drugs for 14 days after polyp removal. You may resume your other current medications today. Await biopsy results. Please call if any questions or concerns.   

## 2016-08-27 NOTE — Progress Notes (Signed)
Pt denies any discomfort. Her abdomen was completely soft.  Pt d/c to home. maw

## 2016-08-27 NOTE — Progress Notes (Signed)
Pt was transfered back to the recovery room.  Pt was drowsy.  Pt's abd was soft.  Pt denied any pain.  Pt will stay on the monitor for 20 mins since she was given more propofol.  Dr. Havery Moros in to speak with the pt and her care partner.  maw

## 2016-08-27 NOTE — Progress Notes (Signed)
Pt sitting on the toilet.  Pt said she did pass a small amount of gas.  Pt is belching up gas.  She began to vomit.  Dr. Havery Moros was notified.  He said pt has a tortuous colon.  Pt palpated pt's abdomen.  Pt was taken back to the stretcher and Joe Rybecki, CRNA asked pt to try to pop her ears by holding her nose with her fingers and bearing down very hard.  Pt trued this twice without any gas release.  Dr. Havery Moros recommended pt go back to the precedure room and they will try to decompress her colon.  I explained all this to pt's care partner.  She states she understands.  maw

## 2016-08-27 NOTE — Progress Notes (Signed)
Called to room to assist during endoscopic procedure.  Patient ID and intended procedure confirmed with present staff. Received instructions for my participation in the procedure from the performing physician.  

## 2016-08-28 ENCOUNTER — Encounter: Payer: Self-pay | Admitting: Family Medicine

## 2016-08-28 ENCOUNTER — Ambulatory Visit (INDEPENDENT_AMBULATORY_CARE_PROVIDER_SITE_OTHER): Payer: PPO | Admitting: Family Medicine

## 2016-08-28 ENCOUNTER — Telehealth: Payer: Self-pay

## 2016-08-28 VITALS — BP 118/62 | HR 71 | Temp 98.2°F | Resp 16 | Ht 65.0 in | Wt 163.0 lb

## 2016-08-28 DIAGNOSIS — Z0000 Encounter for general adult medical examination without abnormal findings: Secondary | ICD-10-CM

## 2016-08-28 DIAGNOSIS — R319 Hematuria, unspecified: Secondary | ICD-10-CM | POA: Diagnosis not present

## 2016-08-28 DIAGNOSIS — Z136 Encounter for screening for cardiovascular disorders: Secondary | ICD-10-CM

## 2016-08-28 DIAGNOSIS — R82998 Other abnormal findings in urine: Secondary | ICD-10-CM

## 2016-08-28 DIAGNOSIS — R8299 Other abnormal findings in urine: Secondary | ICD-10-CM

## 2016-08-28 LAB — COMPREHENSIVE METABOLIC PANEL
ALBUMIN: 4.3 g/dL (ref 3.5–5.2)
ALK PHOS: 78 U/L (ref 39–117)
ALT: 19 U/L (ref 0–35)
AST: 25 U/L (ref 0–37)
BUN: 8 mg/dL (ref 6–23)
CALCIUM: 9.5 mg/dL (ref 8.4–10.5)
CO2: 29 mEq/L (ref 19–32)
CREATININE: 0.9 mg/dL (ref 0.40–1.20)
Chloride: 105 mEq/L (ref 96–112)
GFR: 66.73 mL/min (ref >60.00)
Glucose, Bld: 82 mg/dL (ref 70–99)
POTASSIUM: 4.2 meq/L (ref 3.5–5.1)
SODIUM: 140 meq/L (ref 135–145)
TOTAL PROTEIN: 7.4 g/dL (ref 6.0–8.3)
Total Bilirubin: 0.5 mg/dL (ref 0.2–1.2)

## 2016-08-28 LAB — LIPID PANEL
CHOLESTEROL: 199 mg/dL (ref 0–200)
HDL: 60.6 mg/dL (ref >39.00)
LDL CALC: 117 mg/dL — AB (ref 0–99)
NonHDL: 138.82
TRIGLYCERIDES: 108 mg/dL (ref 0.0–149.0)
Total CHOL/HDL Ratio: 3
VLDL: 21.6 mg/dL (ref 0.0–40.0)

## 2016-08-28 LAB — POCT URINALYSIS DIPSTICK
BILIRUBIN UA: NEGATIVE
GLUCOSE UA: NEGATIVE
Ketones, UA: NEGATIVE
Nitrite, UA: NEGATIVE
Protein, UA: NEGATIVE
UROBILINOGEN UA: 0.2
pH, UA: 6

## 2016-08-28 LAB — CBC WITH DIFFERENTIAL/PLATELET
BASOS PCT: 0.5 % (ref 0.0–3.0)
Basophils Absolute: 0 10*3/uL (ref 0.0–0.1)
EOS ABS: 0.3 10*3/uL (ref 0.0–0.7)
EOS PCT: 3.6 % (ref 0.0–5.0)
HEMATOCRIT: 41.8 % (ref 36.0–46.0)
HEMOGLOBIN: 13.9 g/dL (ref 12.0–15.0)
LYMPHS PCT: 24.8 % (ref 12.0–46.0)
Lymphs Abs: 2.2 10*3/uL (ref 0.7–4.0)
MCHC: 33.3 g/dL (ref 30.0–36.0)
MCV: 95.6 fl (ref 78.0–100.0)
MONOS PCT: 7.3 % (ref 3.0–12.0)
Monocytes Absolute: 0.7 10*3/uL (ref 0.1–1.0)
NEUTROS ABS: 5.7 10*3/uL (ref 1.4–7.7)
Neutrophils Relative %: 63.8 % (ref 43.0–77.0)
PLATELETS: 341 10*3/uL (ref 150.0–400.0)
RBC: 4.38 Mil/uL (ref 3.87–5.11)
RDW: 12.9 % (ref 11.5–15.5)
WBC: 8.9 10*3/uL (ref 4.0–10.5)

## 2016-08-28 NOTE — Progress Notes (Signed)
Pre visit review using our clinic review tool, if applicable. No additional management support is needed unless otherwise documented below in the visit note. 

## 2016-08-28 NOTE — Telephone Encounter (Signed)
  Follow up Call-  Call back number 08/27/2016  Post procedure Call Back phone  # [phone]  Permission to leave phone message Yes  Some recent data might be hidden     Patient questions:  Do you have a fever, pain , or abdominal swelling? No. Pain Score  0 *  Have you tolerated food without any problems? Yes.    Have you been able to return to your normal activities? Yes.    Do you have any questions about your discharge instructions: Diet   No. Medications  No. Follow up visit  No.  Do you have questions or concerns about your Care? No.  Actions: * If pain score is 4 or above: No action needed, pain <4.

## 2016-08-28 NOTE — Progress Notes (Signed)
Subjective:   Danielle Daniel is a 65 y.o. female who presents for Medicare Annual (Subsequent) preventive examination.  Review of Systems:   Review of Systems  Constitutional: Negative for activity change, appetite change and fatigue.  HENT: Negative for hearing loss, congestion, tinnitus and ear discharge.   Eyes: Negative for visual disturbance (see optho q1y -- vision corrected to 20/20 with glasses).  Respiratory: Negative for cough, chest tightness and shortness of breath.   Cardiovascular: Negative for chest pain, palpitations and leg swelling.  Gastrointestinal: Negative for abdominal pain, diarrhea, constipation and abdominal distention.  Genitourinary: Negative for urgency, frequency, decreased urine volume and difficulty urinating.  Musculoskeletal: Negative for back pain, arthralgias and gait problem.  Skin: Negative for color change, pallor and rash.  Neurological: Negative for dizziness, light-headedness, numbness and headaches.  Hematological: Negative for adenopathy. Does not bruise/bleed easily.  Psychiatric/Behavioral: Negative for suicidal ideas, confusion, sleep disturbance, self-injury, dysphoric mood, decreased concentration and agitation.  Pt is able to read and write and can do all ADLs No risk for falling No abuse/ violence in home         Objective:     Vitals: BP 118/62 (BP Location: Left Arm, Patient Position: Sitting, Cuff Size: Normal)   Pulse 71   Temp 98.2 F (36.8 C) (Oral)   Resp 16   Ht 5\' 5"  (1.651 m)   Wt 163 lb (73.9 kg)   SpO2 98%   BMI 27.12 kg/m   Body mass index is 27.12 kg/m.  BP 118/62 (BP Location: Left Arm, Patient Position: Sitting, Cuff Size: Normal)   Pulse 71   Temp 98.2 F (36.8 C) (Oral)   Resp 16   Ht 5\' 5"  (1.651 m)   Wt 163 lb (73.9 kg)   SpO2 98%   BMI 27.12 kg/m  General appearance: alert, cooperative, appears stated age and no distress Head: Normocephalic, without obvious abnormality, atraumatic Eyes:  conjunctivae/corneas clear. PERRL, EOM's intact. Fundi benign. Ears: normal TM's and external ear canals both ears Nose: Nares normal. Septum midline. Mucosa normal. No drainage or sinus tenderness. Throat: lips, mucosa, and tongue normal; teeth and gums normal Neck: no adenopathy, no carotid bruit, no JVD, supple, symmetrical, trachea midline and thyroid not enlarged, symmetric, no tenderness/mass/nodules Back: symmetric, no curvature. ROM normal. No CVA tenderness. Lungs: clear to auscultation bilaterally Breasts: normal appearance, no masses or tenderness Heart: regular rate and rhythm, S1, S2 normal, no murmur, click, rub or gallop Abdomen: soft, non-tender; bowel sounds normal; no masses,  no organomegaly Pelvic: not indicated; post-menopausal, no abnormal Pap smears in past Extremities: extremities normal, atraumatic, no cyanosis or edema-- pain with weight bearing in L knee Pulses: 2+ and symmetric Skin: Skin color, texture, turgor normal. No rashes or lesions Lymph nodes: Cervical, supraclavicular, and axillary nodes normal. Neurologic: Alert and oriented X 3, normal strength and tone. Normal symmetric reflexes. Normal coordination and gait Tobacco History  Smoking Status  . Never Smoker  Smokeless Tobacco  . Never Used     Counseling given: Not Answered   Past Medical History:  Diagnosis Date  . Cataract    left eye  . Constipation   . GERD (gastroesophageal reflux disease)    zantac prn  . HSV (herpes simplex virus) infection   . Hyperlipidemia    tx red yeast rice - now normal per patient   Past Surgical History:  Procedure Laterality Date  . APPENDECTOMY    . CATARACT EXTRACTION Left 04/2016   [facility]  .  EXPLORATORY LAPAROTOMY    . RETINAL DETACHMENT SURGERY    . VITRECTOMY Left   . VITRECTOMY AND CATARACT Left   . WISDOM TOOTH EXTRACTION     Family History  Problem Relation Age of Onset  . Heart disease Mother     CHF  . Heart disease Sister      arrythmia  . Hypertension Sister   . Heart disease Sister     arrythmia  . Hypertension Sister   . Colon cancer Neg Hx   . Esophageal cancer Neg Hx   . Rectal cancer Neg Hx   . Stomach cancer Neg Hx    History  Sexual Activity  . Sexual activity: Not on file    Outpatient Encounter Prescriptions as of 08/28/2016  Medication Sig  . Ascorbic Acid (VITAMIN C) 1000 MG tablet Take 1,000 mg by mouth daily.  . Calcium Carbonate Antacid (TUMS PO) Take 2 tablets by mouth at bedtime.  . Calcium-Magnesium-Vitamin D [id number] MG-MG-UNIT TB24 Take 1 tablet by mouth daily.  Marland Kitchen glucosamine-chondroitin 500-400 MG tablet Take 1 tablet by mouth daily.  . Inositol Niacinate (NIACIN FLUSH FREE) 500 MG CAPS Take 1 capsule by mouth daily.  . Multiple Vitamin (MULTI-VITAMIN PO) Take 1 tablet by mouth daily.  . ranitidine (ZANTAC) 75 MG tablet Take 75 mg by mouth at bedtime.  . Red Yeast Rice 600 MG CAPS Take 2 capsules by mouth daily.   . valACYclovir (VALTREX) 1000 MG tablet TAKE ONE TABLET BY MOUTH EVERY DAY   Facility-Administered Encounter Medications as of 08/28/2016  Medication  . 0.9 %  sodium chloride infusion    Activities of Daily Living In your present state of health, do you have any difficulty performing the following activities: 08/28/2016  Hearing? N  Vision? N  Difficulty concentrating or making decisions? N  Walking or climbing stairs? N  Dressing or bathing? N  Doing errands, shopping? N  Some recent data might be hidden    Patient Care Team: Ann Held, DO as PCP - General (Family Medicine) Sherlynn Stalls, MD as Consulting Physician (Ophthalmology) Manus Gunning, MD as Consulting Physician (Gastroenterology)    Assessment:    CPE Exercise Activities and Dietary recommendations Current Exercise Habits: Home exercise routine, Type of exercise: Other - see comments (recumbant bike), Time (Minutes): 20, Frequency (Times/Week): 3, Weekly Exercise  (Minutes/Week): 60, Intensity: Mild, Exercise limited by: orthopedic condition(s)  Goals    None     Fall Risk Fall Risk  08/28/2016 08/27/2015 06/09/2014  Falls in the past year? Yes No No   Depression Screen PHQ 2/9 Scores 08/28/2016 08/27/2015 06/09/2014 05/19/2013  PHQ - 2 Score 0 0 0 0     Cognitive Function MMSE - Mini Mental State Exam 08/28/2016  Orientation to time 5  Orientation to Place 5  Registration 3  Attention/ Calculation 5  Recall 3  Language- name 2 objects 2  Language- repeat 1  Language- follow 3 step command 3  Language- read & follow direction 1  Write a sentence 1  Copy design 1  Total score 30        Immunization History  Administered Date(s) Administered  . Td 10/20/1998, 12/12/2008   Screening Tests Health Maintenance  Topic Date Due  . INFLUENZA VACCINE  08/28/2017 (Originally 05/20/2016)  . ZOSTAVAX  08/28/2017 (Originally 05/16/2011)  . Hepatitis C Screening  08/28/2017 (Originally July 06, 1951)  . HIV Screening  08/28/2017 (Originally 05/15/1966)  . PNA vac Low Risk Adult (1 of  2 - PCV13) 08/28/2017 (Originally 05/15/2016)  . MAMMOGRAM  05/30/2017  . DEXA SCAN  08/26/2017  . PAP SMEAR  08/26/2018  . TETANUS/TDAP  12/12/2018  . COLONOSCOPY  08/27/2026      Plan:    see AVS During the course of the visit the patient was educated and counseled about the following appropriate screening and preventive services:   Vaccines to include Pneumoccal, Influenza, Hepatitis B, Td, Zostavax, HCV  Electrocardiogram  Cardiovascular Disease  Colorectal cancer screening  Bone density screening  Diabetes screening  Glaucoma screening  Mammography/PAP  Nutrition counseling   Patient Instructions (the written plan) was given to the patient.  1. Ischemic heart disease screen Check labs - Lipid panel - CBC with Differential/Platelet - POCT urinalysis dipstick - Comprehensive metabolic panel  2. Welcome to Medicare preventive visit See above -  EKG 12-Lead  Ann Held, DO  08/28/2016

## 2016-08-28 NOTE — Patient Instructions (Signed)
Preventive Care for Adults, Female A healthy lifestyle and preventive care can promote health and wellness. Preventive health guidelines for women include the following key practices.  A routine yearly physical is a good way to check with your health care provider about your health and preventive screening. It is a chance to share any concerns and updates on your health and to receive a thorough exam.  Visit your dentist for a routine exam and preventive care every 6 months. Brush your teeth twice a day and floss once a day. Good oral hygiene prevents tooth decay and gum disease.  The frequency of eye exams is based on your age, health, family medical history, use of contact lenses, and other factors. Follow your health care provider's recommendations for frequency of eye exams.  Eat a healthy diet. Foods like vegetables, fruits, whole grains, low-fat dairy products, and lean protein foods contain the nutrients you need without too many calories. Decrease your intake of foods high in solid fats, added sugars, and salt. Eat the right amount of calories for you.Get information about a proper diet from your health care provider, if necessary.  Regular physical exercise is one of the most important things you can do for your health. Most adults should get at least 150 minutes of moderate-intensity exercise (any activity that increases your heart rate and causes you to sweat) each week. In addition, most adults need muscle-strengthening exercises on 2 or more days a week.  Maintain a healthy weight. The body mass index (BMI) is a screening tool to identify possible weight problems. It provides an estimate of body fat based on height and weight. Your health care provider can find your BMI and can help you achieve or maintain a healthy weight.For adults 20 years and older:  A BMI below 18.5 is considered underweight.  A BMI of 18.5 to 24.9 is normal.  A BMI of 25 to 29.9 is considered overweight.  A  BMI of 30 and above is considered obese.  Maintain normal blood lipids and cholesterol levels by exercising and minimizing your intake of saturated fat. Eat a balanced diet with plenty of fruit and vegetables. Blood tests for lipids and cholesterol should begin at age 45 and be repeated every 5 years. If your lipid or cholesterol levels are high, you are over 50, or you are at high risk for heart disease, you may need your cholesterol levels checked more frequently.Ongoing high lipid and cholesterol levels should be treated with medicines if diet and exercise are not working.  If you smoke, find out from your health care provider how to quit. If you do not use tobacco, do not start.  Lung cancer screening is recommended for adults aged 45-80 years who are at high risk for developing lung cancer because of a history of smoking. A yearly low-dose CT scan of the lungs is recommended for people who have at least a 30-pack-year history of smoking and are a current smoker or have quit within the past 15 years. A pack year of smoking is smoking an average of 1 pack of cigarettes a day for 1 year (for example: 1 pack a day for 30 years or 2 packs a day for 15 years). Yearly screening should continue until the smoker has stopped smoking for at least 15 years. Yearly screening should be stopped for people who develop a health problem that would prevent them from having lung cancer treatment.  If you are pregnant, do not drink alcohol. If you are  breastfeeding, be very cautious about drinking alcohol. If you are not pregnant and choose to drink alcohol, do not have more than 1 drink per day. One drink is considered to be 12 ounces (355 mL) of beer, 5 ounces (148 mL) of wine, or 1.5 ounces (44 mL) of liquor.  Avoid use of street drugs. Do not share needles with anyone. Ask for help if you need support or instructions about stopping the use of drugs.  High blood pressure causes heart disease and increases the risk  of stroke. Your blood pressure should be checked at least every 1 to 2 years. Ongoing high blood pressure should be treated with medicines if weight loss and exercise do not work.  If you are 55-79 years old, ask your health care provider if you should take aspirin to prevent strokes.  Diabetes screening is done by taking a blood sample to check your blood glucose level after you have not eaten for a certain period of time (fasting). If you are not overweight and you do not have risk factors for diabetes, you should be screened once every 3 years starting at age 45. If you are overweight or obese and you are 40-70 years of age, you should be screened for diabetes every year as part of your cardiovascular risk assessment.  Breast cancer screening is essential preventive care for women. You should practice "breast self-awareness." This means understanding the normal appearance and feel of your breasts and may include breast self-examination. Any changes detected, no matter how small, should be reported to a health care provider. Women in their 20s and 30s should have a clinical breast exam (CBE) by a health care provider as part of a regular health exam every 1 to 3 years. After age 40, women should have a CBE every year. Starting at age 40, women should consider having a mammogram (breast X-ray test) every year. Women who have a family history of breast cancer should talk to their health care provider about genetic screening. Women at a high risk of breast cancer should talk to their health care providers about having an MRI and a mammogram every year.  Breast cancer gene (BRCA)-related cancer risk assessment is recommended for women who have family members with BRCA-related cancers. BRCA-related cancers include breast, ovarian, tubal, and peritoneal cancers. Having family members with these cancers may be associated with an increased risk for harmful changes (mutations) in the breast cancer genes BRCA1 and  BRCA2. Results of the assessment will determine the need for genetic counseling and BRCA1 and BRCA2 testing.  Your health care provider may recommend that you be screened regularly for cancer of the pelvic organs (ovaries, uterus, and vagina). This screening involves a pelvic examination, including checking for microscopic changes to the surface of your cervix (Pap test). You may be encouraged to have this screening done every 3 years, beginning at age 21.  For women ages 30-65, health care providers may recommend pelvic exams and Pap testing every 3 years, or they may recommend the Pap and pelvic exam, combined with testing for human papilloma virus (HPV), every 5 years. Some types of HPV increase your risk of cervical cancer. Testing for HPV may also be done on women of any age with unclear Pap test results.  Other health care providers may not recommend any screening for nonpregnant women who are considered low risk for pelvic cancer and who do not have symptoms. Ask your health care provider if a screening pelvic exam is right for   you.  If you have had past treatment for cervical cancer or a condition that could lead to cancer, you need Pap tests and screening for cancer for at least 20 years after your treatment. If Pap tests have been discontinued, your risk factors (such as having a new sexual partner) need to be reassessed to determine if screening should resume. Some women have medical problems that increase the chance of getting cervical cancer. In these cases, your health care provider may recommend more frequent screening and Pap tests.  Colorectal cancer can be detected and often prevented. Most routine colorectal cancer screening begins at the age of 50 years and continues through age 75 years. However, your health care provider may recommend screening at an earlier age if you have risk factors for colon cancer. On a yearly basis, your health care provider may provide home test kits to check  for hidden blood in the stool. Use of a small camera at the end of a tube, to directly examine the colon (sigmoidoscopy or colonoscopy), can detect the earliest forms of colorectal cancer. Talk to your health care provider about this at age 50, when routine screening begins. Direct exam of the colon should be repeated every 5-10 years through age 75 years, unless early forms of precancerous polyps or small growths are found.  People who are at an increased risk for hepatitis B should be screened for this virus. You are considered at high risk for hepatitis B if:  You were born in a country where hepatitis B occurs often. Talk with your health care provider about which countries are considered high risk.  Your parents were born in a high-risk country and you have not received a shot to protect against hepatitis B (hepatitis B vaccine).  You have HIV or AIDS.  You use needles to inject street drugs.  You live with, or have sex with, someone who has hepatitis B.  You get hemodialysis treatment.  You take certain medicines for conditions like cancer, organ transplantation, and autoimmune conditions.  Hepatitis C blood testing is recommended for all people born from 1945 through 1965 and any individual with known risks for hepatitis C.  Practice safe sex. Use condoms and avoid high-risk sexual practices to reduce the spread of sexually transmitted infections (STIs). STIs include gonorrhea, chlamydia, syphilis, trichomonas, herpes, HPV, and human immunodeficiency virus (HIV). Herpes, HIV, and HPV are viral illnesses that have no cure. They can result in disability, cancer, and death.  You should be screened for sexually transmitted illnesses (STIs) including gonorrhea and chlamydia if:  You are sexually active and are younger than 24 years.  You are older than 24 years and your health care provider tells you that you are at risk for this type of infection.  Your sexual activity has changed  since you were last screened and you are at an increased risk for chlamydia or gonorrhea. Ask your health care provider if you are at risk.  If you are at risk of being infected with HIV, it is recommended that you take a prescription medicine daily to prevent HIV infection. This is called preexposure prophylaxis (PrEP). You are considered at risk if:  You are sexually active and do not regularly use condoms or know the HIV status of your partner(s).  You take drugs by injection.  You are sexually active with a partner who has HIV.  Talk with your health care provider about whether you are at high risk of being infected with HIV. If   you choose to begin PrEP, you should first be tested for HIV. You should then be tested every 3 months for as long as you are taking PrEP.  Osteoporosis is a disease in which the bones lose minerals and strength with aging. This can result in serious bone fractures or breaks. The risk of osteoporosis can be identified using a bone density scan. Women ages 67 years and over and women at risk for fractures or osteoporosis should discuss screening with their health care providers. Ask your health care provider whether you should take a calcium supplement or vitamin D to reduce the rate of osteoporosis.  Menopause can be associated with physical symptoms and risks. Hormone replacement therapy is available to decrease symptoms and risks. You should talk to your health care provider about whether hormone replacement therapy is right for you.  Use sunscreen. Apply sunscreen liberally and repeatedly throughout the day. You should seek shade when your shadow is shorter than you. Protect yourself by wearing long sleeves, pants, a wide-brimmed hat, and sunglasses year round, whenever you are outdoors.  Once a month, do a whole body skin exam, using a mirror to look at the skin on your back. Tell your health care provider of new moles, moles that have irregular borders, moles that  are larger than a pencil eraser, or moles that have changed in shape or color.  Stay current with required vaccines (immunizations).  Influenza vaccine. All adults should be immunized every year.  Tetanus, diphtheria, and acellular pertussis (Td, Tdap) vaccine. Pregnant women should receive 1 dose of Tdap vaccine during each pregnancy. The dose should be obtained regardless of the length of time since the last dose. Immunization is preferred during the 27th-36th week of gestation. An adult who has not previously received Tdap or who does not know her vaccine status should receive 1 dose of Tdap. This initial dose should be followed by tetanus and diphtheria toxoids (Td) booster doses every 10 years. Adults with an unknown or incomplete history of completing a 3-dose immunization series with Td-containing vaccines should begin or complete a primary immunization series including a Tdap dose. Adults should receive a Td booster every 10 years.  Varicella vaccine. An adult without evidence of immunity to varicella should receive 2 doses or a second dose if she has previously received 1 dose. Pregnant females who do not have evidence of immunity should receive the first dose after pregnancy. This first dose should be obtained before leaving the health care facility. The second dose should be obtained 4-8 weeks after the first dose.  Human papillomavirus (HPV) vaccine. Females aged 13-26 years who have not received the vaccine previously should obtain the 3-dose series. The vaccine is not recommended for use in pregnant females. However, pregnancy testing is not needed before receiving a dose. If a female is found to be pregnant after receiving a dose, no treatment is needed. In that case, the remaining doses should be delayed until after the pregnancy. Immunization is recommended for any person with an immunocompromised condition through the age of 61 years if she did not get any or all doses earlier. During the  3-dose series, the second dose should be obtained 4-8 weeks after the first dose. The third dose should be obtained 24 weeks after the first dose and 16 weeks after the second dose.  Zoster vaccine. One dose is recommended for adults aged 30 years or older unless certain conditions are present.  Measles, mumps, and rubella (MMR) vaccine. Adults born  before 1957 generally are considered immune to measles and mumps. Adults born in 1957 or later should have 1 or more doses of MMR vaccine unless there is a contraindication to the vaccine or there is laboratory evidence of immunity to each of the three diseases. A routine second dose of MMR vaccine should be obtained at least 28 days after the first dose for students attending postsecondary schools, health care workers, or international travelers. People who received inactivated measles vaccine or an unknown type of measles vaccine during 1963-1967 should receive 2 doses of MMR vaccine. People who received inactivated mumps vaccine or an unknown type of mumps vaccine before 1979 and are at high risk for mumps infection should consider immunization with 2 doses of MMR vaccine. For females of childbearing age, rubella immunity should be determined. If there is no evidence of immunity, females who are not pregnant should be vaccinated. If there is no evidence of immunity, females who are pregnant should delay immunization until after pregnancy. Unvaccinated health care workers born before 1957 who lack laboratory evidence of measles, mumps, or rubella immunity or laboratory confirmation of disease should consider measles and mumps immunization with 2 doses of MMR vaccine or rubella immunization with 1 dose of MMR vaccine.  Pneumococcal 13-valent conjugate (PCV13) vaccine. When indicated, a person who is uncertain of his immunization history and has no record of immunization should receive the PCV13 vaccine. All adults 65 years of age and older should receive this  vaccine. An adult aged 19 years or older who has certain medical conditions and has not been previously immunized should receive 1 dose of PCV13 vaccine. This PCV13 should be followed with a dose of pneumococcal polysaccharide (PPSV23) vaccine. Adults who are at high risk for pneumococcal disease should obtain the PPSV23 vaccine at least 8 weeks after the dose of PCV13 vaccine. Adults older than 65 years of age who have normal immune system function should obtain the PPSV23 vaccine dose at least 1 year after the dose of PCV13 vaccine.  Pneumococcal polysaccharide (PPSV23) vaccine. When PCV13 is also indicated, PCV13 should be obtained first. All adults aged 65 years and older should be immunized. An adult younger than age 65 years who has certain medical conditions should be immunized. Any person who resides in a nursing home or long-term care facility should be immunized. An adult smoker should be immunized. People with an immunocompromised condition and certain other conditions should receive both PCV13 and PPSV23 vaccines. People with human immunodeficiency virus (HIV) infection should be immunized as soon as possible after diagnosis. Immunization during chemotherapy or radiation therapy should be avoided. Routine use of PPSV23 vaccine is not recommended for American Indians, [place] Natives, or people younger than 65 years unless there are medical conditions that require PPSV23 vaccine. When indicated, people who have unknown immunization and have no record of immunization should receive PPSV23 vaccine. One-time revaccination 5 years after the first dose of PPSV23 is recommended for people aged 19-64 years who have chronic kidney failure, nephrotic syndrome, asplenia, or immunocompromised conditions. People who received 1-2 doses of PPSV23 before age 65 years should receive another dose of PPSV23 vaccine at age 65 years or later if at least 5 years have passed since the previous dose. Doses of PPSV23 are not  needed for people immunized with PPSV23 at or after age 65 years.  Meningococcal vaccine. Adults with asplenia or persistent complement component deficiencies should receive 2 doses of quadrivalent meningococcal conjugate (MenACWY-D) vaccine. The doses should be obtained   at least 2 months apart. Microbiologists working with certain meningococcal bacteria, [place] recruits, people at risk during an outbreak, and people who travel to or live in countries with a high rate of meningitis should be immunized. A first-year college student up through age 34 years who is living in a residence hall should receive a dose if she did not receive a dose on or after her 16th birthday. Adults who have certain high-risk conditions should receive one or more doses of vaccine.  Hepatitis A vaccine. Adults who wish to be protected from this disease, have certain high-risk conditions, work with hepatitis A-infected animals, work in hepatitis A research labs, or travel to or work in countries with a high rate of hepatitis A should be immunized. Adults who were previously unvaccinated and who anticipate close contact with an international adoptee during the first 60 days after arrival in the [place] from a country with a high rate of hepatitis A should be immunized.  Hepatitis B vaccine. Adults who wish to be protected from this disease, have certain high-risk conditions, may be exposed to blood or other infectious body fluids, are household contacts or sex partners of hepatitis B positive people, are clients or workers in certain care facilities, or travel to or work in countries with a high rate of hepatitis B should be immunized.  Haemophilus influenzae type b (Hib) vaccine. A previously unvaccinated person with asplenia or sickle cell disease or having a scheduled splenectomy should receive 1 dose of Hib vaccine. Regardless of previous immunization, a recipient of a hematopoietic stem cell transplant should receive a  3-dose series 6-12 months after her successful transplant. Hib vaccine is not recommended for adults with HIV infection. Preventive Services / Frequency Ages 35 to 4 years  Blood pressure check.** / Every 3-5 years.  Lipid and cholesterol check.** / Every 5 years beginning at age 60.  Clinical breast exam.** / Every 3 years for women in their 71s and 10s.  BRCA-related cancer risk assessment.** / For women who have family members with a BRCA-related cancer (breast, ovarian, tubal, or peritoneal cancers).  Pap test.** / Every 2 years from ages 76 through 26. Every 3 years starting at age 61 through age 76 or 93 with a history of 3 consecutive normal Pap tests.  HPV screening.** / Every 3 years from ages 37 through ages 60 to 51 with a history of 3 consecutive normal Pap tests.  Hepatitis C blood test.** / For any individual with known risks for hepatitis C.  Skin self-exam. / Monthly.  Influenza vaccine. / Every year.  Tetanus, diphtheria, and acellular pertussis (Tdap, Td) vaccine.** / Consult your health care provider. Pregnant women should receive 1 dose of Tdap vaccine during each pregnancy. 1 dose of Td every 10 years.  Varicella vaccine.** / Consult your health care provider. Pregnant females who do not have evidence of immunity should receive the first dose after pregnancy.  HPV vaccine. / 3 doses over 6 months, if 93 and younger. The vaccine is not recommended for use in pregnant females. However, pregnancy testing is not needed before receiving a dose.  Measles, mumps, rubella (MMR) vaccine.** / You need at least 1 dose of MMR if you were born in 1957 or later. You may also need a 2nd dose. For females of childbearing age, rubella immunity should be determined. If there is no evidence of immunity, females who are not pregnant should be vaccinated. If there is no evidence of immunity, females who are  pregnant should delay immunization until after pregnancy.  Pneumococcal  13-valent conjugate (PCV13) vaccine.** / Consult your health care provider.  Pneumococcal polysaccharide (PPSV23) vaccine.** / 1 to 2 doses if you smoke cigarettes or if you have certain conditions.  Meningococcal vaccine.** / 1 dose if you are age 68 to 8 years and a Market researcher living in a residence hall, or have one of several medical conditions, you need to get vaccinated against meningococcal disease. You may also need additional booster doses.  Hepatitis A vaccine.** / Consult your health care provider.  Hepatitis B vaccine.** / Consult your health care provider.  Haemophilus influenzae type b (Hib) vaccine.** / Consult your health care provider. Ages 7 to 53 years  Blood pressure check.** / Every year.  Lipid and cholesterol check.** / Every 5 years beginning at age 25 years.  Lung cancer screening. / Every year if you are aged 11-80 years and have a 30-pack-year history of smoking and currently smoke or have quit within the past 15 years. Yearly screening is stopped once you have quit smoking for at least 15 years or develop a health problem that would prevent you from having lung cancer treatment.  Clinical breast exam.** / Every year after age 48 years.  BRCA-related cancer risk assessment.** / For women who have family members with a BRCA-related cancer (breast, ovarian, tubal, or peritoneal cancers).  Mammogram.** / Every year beginning at age 41 years and continuing for as long as you are in good health. Consult with your health care provider.  Pap test.** / Every 3 years starting at age 65 years through age 37 or 70 years with a history of 3 consecutive normal Pap tests.  HPV screening.** / Every 3 years from ages 72 years through ages 60 to 40 years with a history of 3 consecutive normal Pap tests.  Fecal occult blood test (FOBT) of stool. / Every year beginning at age 21 years and continuing until age 5 years. You may not need to do this test if you get  a colonoscopy every 10 years.  Flexible sigmoidoscopy or colonoscopy.** / Every 5 years for a flexible sigmoidoscopy or every 10 years for a colonoscopy beginning at age 35 years and continuing until age 48 years.  Hepatitis C blood test.** / For all people born from [birth year] through 1965 and any individual with known risks for hepatitis C.  Skin self-exam. / Monthly.  Influenza vaccine. / Every year.  Tetanus, diphtheria, and acellular pertussis (Tdap/Td) vaccine.** / Consult your health care provider. Pregnant women should receive 1 dose of Tdap vaccine during each pregnancy. 1 dose of Td every 10 years.  Varicella vaccine.** / Consult your health care provider. Pregnant females who do not have evidence of immunity should receive the first dose after pregnancy.  Zoster vaccine.** / 1 dose for adults aged 30 years or older.  Measles, mumps, rubella (MMR) vaccine.** / You need at least 1 dose of MMR if you were born in 1957 or later. You may also need a second dose. For females of childbearing age, rubella immunity should be determined. If there is no evidence of immunity, females who are not pregnant should be vaccinated. If there is no evidence of immunity, females who are pregnant should delay immunization until after pregnancy.  Pneumococcal 13-valent conjugate (PCV13) vaccine.** / Consult your health care provider.  Pneumococcal polysaccharide (PPSV23) vaccine.** / 1 to 2 doses if you smoke cigarettes or if you have certain conditions.  Meningococcal vaccine.** /  Consult your health care provider.  Hepatitis A vaccine.** / Consult your health care provider.  Hepatitis B vaccine.** / Consult your health care provider.  Haemophilus influenzae type b (Hib) vaccine.** / Consult your health care provider. Ages 64 years and over  Blood pressure check.** / Every year.  Lipid and cholesterol check.** / Every 5 years beginning at age 23 years.  Lung cancer screening. / Every year if you  are aged 16-80 years and have a 30-pack-year history of smoking and currently smoke or have quit within the past 15 years. Yearly screening is stopped once you have quit smoking for at least 15 years or develop a health problem that would prevent you from having lung cancer treatment.  Clinical breast exam.** / Every year after age 74 years.  BRCA-related cancer risk assessment.** / For women who have family members with a BRCA-related cancer (breast, ovarian, tubal, or peritoneal cancers).  Mammogram.** / Every year beginning at age 44 years and continuing for as long as you are in good health. Consult with your health care provider.  Pap test.** / Every 3 years starting at age 58 years through age 22 or 39 years with 3 consecutive normal Pap tests. Testing can be stopped between 65 and 70 years with 3 consecutive normal Pap tests and no abnormal Pap or HPV tests in the past 10 years.  HPV screening.** / Every 3 years from ages 64 years through ages 70 or 61 years with a history of 3 consecutive normal Pap tests. Testing can be stopped between 65 and 70 years with 3 consecutive normal Pap tests and no abnormal Pap or HPV tests in the past 10 years.  Fecal occult blood test (FOBT) of stool. / Every year beginning at age 40 years and continuing until age 27 years. You may not need to do this test if you get a colonoscopy every 10 years.  Flexible sigmoidoscopy or colonoscopy.** / Every 5 years for a flexible sigmoidoscopy or every 10 years for a colonoscopy beginning at age 7 years and continuing until age 32 years.  Hepatitis C blood test.** / For all people born from [birth year] through 1965 and any individual with known risks for hepatitis C.  Osteoporosis screening.** / A one-time screening for women ages 30 years and over and women at risk for fractures or osteoporosis.  Skin self-exam. / Monthly.  Influenza vaccine. / Every year.  Tetanus, diphtheria, and acellular pertussis (Tdap/Td)  vaccine.** / 1 dose of Td every 10 years.  Varicella vaccine.** / Consult your health care provider.  Zoster vaccine.** / 1 dose for adults aged 35 years or older.  Pneumococcal 13-valent conjugate (PCV13) vaccine.** / Consult your health care provider.  Pneumococcal polysaccharide (PPSV23) vaccine.** / 1 dose for all adults aged 46 years and older.  Meningococcal vaccine.** / Consult your health care provider.  Hepatitis A vaccine.** / Consult your health care provider.  Hepatitis B vaccine.** / Consult your health care provider.  Haemophilus influenzae type b (Hib) vaccine.** / Consult your health care provider. ** Family history and personal history of risk and conditions may change your health care provider's recommendations.   This information is not intended to replace advice given to you by your health care provider. Make sure you discuss any questions you have with your health care provider.   Document Released: 12/02/2001 Document Revised: 10/27/2014 Document Reviewed: 03/03/2011 Elsevier Interactive Patient Education [employer].

## 2016-08-29 LAB — URINE CULTURE

## 2016-09-02 ENCOUNTER — Encounter: Payer: Self-pay | Admitting: Gastroenterology

## 2016-09-05 ENCOUNTER — Encounter: Payer: Self-pay | Admitting: Family Medicine

## 2016-09-09 ENCOUNTER — Telehealth: Payer: Self-pay | Admitting: Gastroenterology

## 2016-09-09 NOTE — Telephone Encounter (Signed)
Patient had multiple questions about her colonoscopy results. Explained the difference in the polyps and why she has a recall for her next colonoscopy in 5 years. She also had questions about how she can prevent having a tortuous colon? Why she has this? How she can prevent having adenomatous polyps? Please advise.

## 2016-09-09 NOTE — Telephone Encounter (Signed)
Danielle Daniel can you please relay the following: - she had adenomatous polyps, which are one of the most common types of polyps people can get in her age group. They are considered pre-cancerous, in that if they were never removed it had potential to turn into cancer over the next several years. Given she had adenomatous polyps, per national guidelines on polyp follow up, it is recommended she had a surveillance colonoscopy in 5 years - she had a tortous colon which was technically challenging to reach the cecum. This can be a common finding and nothing she needs to worry about, it usually does not relate to any bowel symptoms. I put a notation in her record more for the next time she has a colonoscopy the physician is aware of it - polyps are usually due to aging and genetics. Modifiable risk factors include tobacco use and eating red meat frequently. Unfortunately not much else she can do to minimize her risk of polyps or colon cancer other than taking an aspirin daily, although this must be weighed against risks of bleeding on aspirin. I don't consider her polyps to be high risk  If she still has questions about these issues following relaying this I can see her in clinic. Please reassure her these findings are common and not concerning in my opinion

## 2016-09-10 NOTE — Telephone Encounter (Signed)
Called patient, discussed all of the information and recommendations again with patient. She is reassured and will follow up as needed or in 5 years for next colonoscopy.

## 2016-11-19 DIAGNOSIS — H2511 Age-related nuclear cataract, right eye: Secondary | ICD-10-CM | POA: Diagnosis not present

## 2016-11-19 DIAGNOSIS — H31002 Unspecified chorioretinal scars, left eye: Secondary | ICD-10-CM | POA: Diagnosis not present

## 2016-11-19 DIAGNOSIS — Z961 Presence of intraocular lens: Secondary | ICD-10-CM | POA: Diagnosis not present

## 2016-11-19 DIAGNOSIS — H26492 Other secondary cataract, left eye: Secondary | ICD-10-CM | POA: Diagnosis not present

## 2016-11-19 DIAGNOSIS — H35373 Puckering of macula, bilateral: Secondary | ICD-10-CM | POA: Diagnosis not present

## 2016-11-19 DIAGNOSIS — H25011 Cortical age-related cataract, right eye: Secondary | ICD-10-CM | POA: Diagnosis not present

## 2016-12-04 DIAGNOSIS — H26492 Other secondary cataract, left eye: Secondary | ICD-10-CM | POA: Diagnosis not present

## 2017-03-05 DIAGNOSIS — Z961 Presence of intraocular lens: Secondary | ICD-10-CM | POA: Diagnosis not present

## 2017-03-05 DIAGNOSIS — H35373 Puckering of macula, bilateral: Secondary | ICD-10-CM | POA: Diagnosis not present

## 2017-03-05 DIAGNOSIS — H25011 Cortical age-related cataract, right eye: Secondary | ICD-10-CM | POA: Diagnosis not present

## 2017-03-05 DIAGNOSIS — H40023 Open angle with borderline findings, high risk, bilateral: Secondary | ICD-10-CM | POA: Diagnosis not present

## 2017-03-24 DIAGNOSIS — M545 Low back pain: Secondary | ICD-10-CM | POA: Diagnosis not present

## 2017-03-24 DIAGNOSIS — M62830 Muscle spasm of back: Secondary | ICD-10-CM | POA: Diagnosis not present

## 2017-03-24 DIAGNOSIS — M5441 Lumbago with sciatica, right side: Secondary | ICD-10-CM | POA: Diagnosis not present

## 2017-03-24 DIAGNOSIS — M9903 Segmental and somatic dysfunction of lumbar region: Secondary | ICD-10-CM | POA: Diagnosis not present

## 2017-06-17 ENCOUNTER — Telehealth: Payer: Self-pay | Admitting: Family Medicine

## 2017-06-17 ENCOUNTER — Other Ambulatory Visit: Payer: Self-pay | Admitting: Family Medicine

## 2017-06-17 DIAGNOSIS — Z1231 Encounter for screening mammogram for malignant neoplasm of breast: Secondary | ICD-10-CM

## 2017-06-17 NOTE — Telephone Encounter (Signed)
Ok to switch 

## 2017-06-17 NOTE — Telephone Encounter (Addendum)
Patient scheduled with Dr. Lorelei Pont for 08/31/2017, requesting a physical at the time

## 2017-06-17 NOTE — Telephone Encounter (Signed)
ok 

## 2017-06-17 NOTE — Telephone Encounter (Signed)
Relation to pt: self  Call back number:[phone] Pharmacy:  Reason for call:  Patient would like to transfer from Dr. Birdie Riddle to Dr. Lorelei Pont due to location, please advise

## 2017-06-25 ENCOUNTER — Ambulatory Visit
Admission: RE | Admit: 2017-06-25 | Discharge: 2017-06-25 | Disposition: A | Discharge status: Home / Self Care | Payer: PPO | Source: Ambulatory Visit | Attending: Family Medicine | Admitting: Family Medicine

## 2017-06-25 ENCOUNTER — Other Ambulatory Visit: Payer: Self-pay | Admitting: Family Medicine

## 2017-06-25 DIAGNOSIS — Z1231 Encounter for screening mammogram for malignant neoplasm of breast: Secondary | ICD-10-CM | POA: Diagnosis not present

## 2017-07-02 ENCOUNTER — Other Ambulatory Visit: Payer: Self-pay | Admitting: Family Medicine

## 2017-07-02 ENCOUNTER — Encounter: Payer: Self-pay | Admitting: Family Medicine

## 2017-07-02 DIAGNOSIS — M25511 Pain in right shoulder: Secondary | ICD-10-CM

## 2017-07-15 DIAGNOSIS — M25511 Pain in right shoulder: Secondary | ICD-10-CM | POA: Diagnosis not present

## 2017-07-16 DIAGNOSIS — M5412 Radiculopathy, cervical region: Secondary | ICD-10-CM | POA: Diagnosis not present

## 2017-07-21 DIAGNOSIS — M5412 Radiculopathy, cervical region: Secondary | ICD-10-CM | POA: Diagnosis not present

## 2017-07-23 DIAGNOSIS — M5412 Radiculopathy, cervical region: Secondary | ICD-10-CM | POA: Diagnosis not present

## 2017-07-28 DIAGNOSIS — M5412 Radiculopathy, cervical region: Secondary | ICD-10-CM | POA: Diagnosis not present

## 2017-08-04 DIAGNOSIS — M5412 Radiculopathy, cervical region: Secondary | ICD-10-CM | POA: Diagnosis not present

## 2017-08-06 DIAGNOSIS — M5412 Radiculopathy, cervical region: Secondary | ICD-10-CM | POA: Diagnosis not present

## 2017-08-29 NOTE — Progress Notes (Addendum)
[facility] [address] [place], [place] [address] [phone]  Date:  08/31/2017   Name:  Danielle Daniel   DOB:  [birth year]   MRN:  [id number]  PCP:  Darreld Mclean, MD    Chief Complaint: Annual Exam (Pt here for CPE. Declined flu and pneumonia vaccine. )   History of Present Illness:  Danielle Daniel is a 66 y.o. very pleasant female patient who presents with the following:  Here today for a CPE- former pt of Dr. Birdie Riddle who has moved to a new location.  This is her first visit with me  History of GERD, cataracts, hyperlipidemia controlled with niacin  States that she is basically healthy,  I only come to the doctor once a year She is going to a PT at [facility] right now for a burning pain in her right shoulder.  The PT helps for a couple of days, but then it will come back.  She has been doing PT and trying to follow all their recommendations for a month or so.  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 She tried to use a treadmill but it bothered her knees.  She tried orange theory but it was too intense for her. She plans to try silver sneakers instead.   Pap: 2016, negative Mammo: 9/18 Dexa: 2 years ago, she would like to do today if possible Flu:  Declines to do today Pneumonia: due, pt declines today Shingrix: due Labs:  About a year ago Colonoscopy: 2017 She is fasting today  BP Readings from Last 3 Encounters:  08/31/17 138/82  08/28/16 118/62  08/27/16 136/79   Wt Readings from Last 3 Encounters:  08/31/17 164 lb 3.2 oz (74.5 kg)  08/28/16 163 lb (73.9 kg)  08/27/16 163 lb (73.9 kg)     Patient Active Problem List   Diagnosis Date Noted  . Eustachian tube dysfunction 10/10/2014  . Lymphadenopathy 10/10/2014  .  Trapezius muscle spasm 05/19/2013  . Screening for malignant neoplasm of cervix 02/27/2012  . Routine general medical examination at a health care facility 02/27/2012  . VISUAL CHANGES 12/20/2010  . GERD 11/07/2010  . OTH SYMPTOMS INVOLVING SKIN&INTEG TISSUES 11/07/2010  . DYSPHAGIA 11/07/2010  . CONSTIPATION, CHRONIC 12/18/2009  . HAND SPRAIN 10/27/2007    Past Medical History:  Diagnosis Date  . Cataract    left eye  . Constipation   . GERD (gastroesophageal reflux disease)    zantac prn  . HSV (herpes simplex virus) infection   . Hyperlipidemia    tx red yeast rice - now normal per patient    Past Surgical History:  Procedure Laterality Date  . APPENDECTOMY    . CATARACT EXTRACTION Left 04/2016   [facility]  . EXPLORATORY LAPAROTOMY    . RETINAL DETACHMENT SURGERY    . VITRECTOMY Left   . VITRECTOMY AND CATARACT Left   . WISDOM TOOTH EXTRACTION      Social History   Tobacco Use  . Smoking status: Never Smoker  . Smokeless tobacco: Never Used  Substance Use Topics  . Alcohol use: Yes    Comment: occasionally beer/wine  . Drug use: No    Family History  Problem Relation Age of Onset  . Heart disease Mother  CHF  . Heart disease Sister        arrythmia  . Hypertension Sister   . Heart disease Sister        arrythmia  . Hypertension Sister   . Colon cancer Neg Hx   . Esophageal cancer Neg Hx   . Rectal cancer Neg Hx   . Stomach cancer Neg Hx     Allergies  Allergen Reactions  . Cortisone Rash    Medication list has been reviewed and updated.  Current Outpatient Medications on File Prior to Visit  Medication Sig Dispense Refill  . Ascorbic Acid (VITAMIN C) 1000 MG tablet Take 1,000 mg by mouth daily.    . Calcium Carbonate Antacid (TUMS PO) Take 2 tablets by mouth at bedtime.    . Calcium-Magnesium-Vitamin D [phone] MG-MG-UNIT TB24 Take 1 tablet by mouth daily.    Marland Kitchen docusate sodium (COLACE) 100 MG capsule Take 100 mg daily by mouth.    .  Inositol Niacinate (NIACIN FLUSH FREE) 500 MG CAPS Take 1 capsule by mouth daily.    . Multiple Vitamin (MULTI-VITAMIN PO) Take 1 tablet by mouth daily.    . Red Yeast Rice 600 MG CAPS Take 2 capsules by mouth daily.     . valACYclovir (VALTREX) 1000 MG tablet TAKE ONE TABLET BY MOUTH EVERY DAY (Patient taking differently: TAKE ONE TABLET BY MOUTH AS NEEDED) 30 tablet 2   Current Facility-Administered Medications on File Prior to Visit  Medication Dose Route Frequency Provider Last Rate Last Dose  . 0.9 %  sodium chloride infusion  500 mL Intravenous Continuous Armbruster, Carlota Raspberry, MD        Review of Systems:  As per HPI- otherwise negative. No CP or SOB with exercise No breast or skin changes or concerns    Physical Examination: Vitals:   08/31/17 0842  BP: 138/82  Pulse: 65  Temp: 97.7 F (36.5 C)  SpO2: 97%   Vitals:   08/31/17 0842  Weight: 164 lb 3.2 oz (74.5 kg)  Height: 5' 5.5" (1.664 m)   Body mass index is 26.91 kg/m. Ideal Body Weight: Weight in (lb) to have BMI = 25: 152.2  GEN: WDWN, NAD, Non-toxic, A & O x 3, mild overweight, looks well HEENT: Atraumatic, Normocephalic. Neck supple. No masses, No LAD. Bilateral TM wnl, oropharynx normal.  PEERL,EOMI.   Ears and Nose: No external deformity. CV: RRR, No M/G/R. No JVD. No thrill. No extra heart sounds. PULM: CTA B, no wheezes, crackles, rhonchi. No retractions. No resp. distress. No accessory muscle use. ABD: S, NT, ND. No rebound. No HSM. EXTR: No c/c/e NEURO Normal gait.  PSYCH: Normally interactive. Conversant. Not depressed or anxious appearing.  Calm demeanor.    Assessment and Plan: Physical exam  Screening for deficiency anemia - Plan: CBC  Screening for hyperlipidemia - Plan: Lipid panel  Encounter for hepatitis C screening test for low risk patient - Plan: Hepatitis C antibody  Screening for diabetes mellitus - Plan: Comprehensive metabolic panel, Hemoglobin A1c  Estrogen deficiency -  Plan: DG Bone Density  Here today for a CPE Repeat BP about the same- she will monitor this at home and let me know if higher than 140/90 She plans to get back into an exercise program  She declines immunizations today, will get labs and set up a bone density  Will plan further follow- up pending labs.   Signed Lamar Blinks, MD  Received her labs  Results for orders placed or performed  in visit on 08/31/17  CBC  Result Value Ref Range   WBC 8.1 4.0 - 10.5 K/uL   RBC 4.44 3.87 - 5.11 Mil/uL   Platelets 337.0 150.0 - 400.0 K/uL   Hemoglobin 14.4 12.0 - 15.0 g/dL   HCT 43.5 36.0 - 46.0 %   MCV 98.0 78.0 - 100.0 fl   MCHC 33.2 30.0 - 36.0 g/dL   RDW 13.1 11.5 - 15.5 %  Comprehensive metabolic panel  Result Value Ref Range   Sodium 140 135 - 145 mEq/L   Potassium 4.3 3.5 - 5.1 mEq/L   Chloride 101 96 - 112 mEq/L   CO2 31 19 - 32 mEq/L   Glucose, Bld 94 70 - 99 mg/dL   BUN 15 6 - 23 mg/dL   Creatinine, Ser 0.89 0.40 - 1.20 mg/dL   Total Bilirubin 0.4 0.2 - 1.2 mg/dL   Alkaline Phosphatase 73 39 - 117 U/L   AST 23 0 - 37 U/L   ALT 24 0 - 35 U/L   Total Protein 7.6 6.0 - 8.3 g/dL   Albumin 4.5 3.5 - 5.2 g/dL   Calcium 9.8 8.4 - 10.5 mg/dL   GFR 67.38 >60.00 mL/min  Hemoglobin A1c  Result Value Ref Range   Hgb A1c MFr Bld 5.5 4.6 - 6.5 %  Lipid panel  Result Value Ref Range   Cholesterol 205 (H) 0 - 200 mg/dL   Triglycerides 107.0 0.0 - 149.0 mg/dL   HDL 63.40 >39.00 mg/dL   VLDL 21.4 0.0 - 40.0 mg/dL   LDL Cholesterol 121 (H) 0 - 99 mg/dL   Total CHOL/HDL Ratio 3    NonHDL 142.08    Message to pt

## 2017-08-31 ENCOUNTER — Ambulatory Visit (INDEPENDENT_AMBULATORY_CARE_PROVIDER_SITE_OTHER): Payer: PPO | Admitting: Family Medicine

## 2017-08-31 ENCOUNTER — Encounter: Payer: Self-pay | Admitting: Family Medicine

## 2017-08-31 VITALS — BP 138/82 | HR 65 | Temp 97.7°F | Ht 65.5 in | Wt 164.2 lb

## 2017-08-31 DIAGNOSIS — E2839 Other primary ovarian failure: Secondary | ICD-10-CM | POA: Diagnosis not present

## 2017-08-31 DIAGNOSIS — Z0000 Encounter for general adult medical examination without abnormal findings: Secondary | ICD-10-CM | POA: Diagnosis not present

## 2017-08-31 DIAGNOSIS — Z131 Encounter for screening for diabetes mellitus: Secondary | ICD-10-CM | POA: Diagnosis not present

## 2017-08-31 DIAGNOSIS — Z130 Encounter for screening for diseases of the blood and blood-forming organs and certain disorders involving the immune mechanism: Secondary | ICD-10-CM | POA: Diagnosis not present

## 2017-08-31 DIAGNOSIS — Z1159 Encounter for screening for other viral diseases: Secondary | ICD-10-CM

## 2017-08-31 DIAGNOSIS — Z13220 Encounter for screening for lipoid disorders: Secondary | ICD-10-CM

## 2017-08-31 LAB — LIPID PANEL
CHOL/HDL RATIO: 3
Cholesterol: 205 mg/dL — ABNORMAL HIGH (ref 0–200)
HDL: 63.4 mg/dL (ref >39.00)
LDL CALC: 121 mg/dL — AB (ref 0–99)
NonHDL: 142.08
TRIGLYCERIDES: 107 mg/dL (ref 0.0–149.0)
VLDL: 21.4 mg/dL (ref 0.0–40.0)

## 2017-08-31 LAB — COMPREHENSIVE METABOLIC PANEL
ALBUMIN: 4.5 g/dL (ref 3.5–5.2)
ALT: 24 U/L (ref 0–35)
AST: 23 U/L (ref 0–37)
Alkaline Phosphatase: 73 U/L (ref 39–117)
BILIRUBIN TOTAL: 0.4 mg/dL (ref 0.2–1.2)
BUN: 15 mg/dL (ref 6–23)
CALCIUM: 9.8 mg/dL (ref 8.4–10.5)
CHLORIDE: 101 meq/L (ref 96–112)
CO2: 31 meq/L (ref 19–32)
Creatinine, Ser: 0.89 mg/dL (ref 0.40–1.20)
GFR: 67.38 mL/min (ref >60.00)
GLUCOSE: 94 mg/dL (ref 70–99)
Potassium: 4.3 mEq/L (ref 3.5–5.1)
SODIUM: 140 meq/L (ref 135–145)
TOTAL PROTEIN: 7.6 g/dL (ref 6.0–8.3)

## 2017-08-31 LAB — CBC
HEMATOCRIT: 43.5 % (ref 36.0–46.0)
Hemoglobin: 14.4 g/dL (ref 12.0–15.0)
MCHC: 33.2 g/dL (ref 30.0–36.0)
MCV: 98 fl (ref 78.0–100.0)
PLATELETS: 337 10*3/uL (ref 150.0–400.0)
RBC: 4.44 Mil/uL (ref 3.87–5.11)
RDW: 13.1 % (ref 11.5–15.5)
WBC: 8.1 10*3/uL (ref 4.0–10.5)

## 2017-08-31 LAB — HEMOGLOBIN A1C: Hgb A1c MFr Bld: 5.5 % (ref 4.6–6.5)

## 2017-08-31 NOTE — Patient Instructions (Signed)
It was a pleasure to see you today!  Take care and I will be in touch with your labs asap I ordered a bone density scan for you- you can schedule this at your convenience.  You can stop by the imaging dept today and schedule, or call them at [phone] Please do try to start a regular exercise program for your health    Health Maintenance for Postmenopausal Women Menopause is a normal process in which your reproductive ability comes to an end. This process happens gradually over a span of months to years, usually between the ages of 76 and 56. Menopause is complete when you have missed 12 consecutive menstrual periods. It is important to talk with your health care provider about some of the most common conditions that affect postmenopausal women, such as heart disease, cancer, and bone loss (osteoporosis). Adopting a healthy lifestyle and getting preventive care can help to promote your health and wellness. Those actions can also lower your chances of developing some of these common conditions. What should I know about menopause? During menopause, you may experience a number of symptoms, such as:  Moderate-to-severe hot flashes.  Night sweats.  Decrease in sex drive.  Mood swings.  Headaches.  Tiredness.  Irritability.  Memory problems.  Insomnia.  Choosing to treat or not to treat menopausal changes is an individual decision that you make with your health care provider. What should I know about hormone replacement therapy and supplements? Hormone therapy products are effective for treating symptoms that are associated with menopause, such as hot flashes and night sweats. Hormone replacement carries certain risks, especially as you become older. If you are thinking about using estrogen or estrogen with progestin treatments, discuss the benefits and risks with your health care provider. What should I know about heart disease and stroke? Heart disease, heart attack, and stroke  become more likely as you age. This may be due, in part, to the hormonal changes that your body experiences during menopause. These can affect how your body processes dietary fats, triglycerides, and cholesterol. Heart attack and stroke are both medical emergencies. There are many things that you can do to help prevent heart disease and stroke:  Have your blood pressure checked at least every 1-2 years. High blood pressure causes heart disease and increases the risk of stroke.  If you are 74-42 years old, ask your health care provider if you should take aspirin to prevent a heart attack or a stroke.  Do not use any tobacco products, including cigarettes, chewing tobacco, or electronic cigarettes. If you need help quitting, ask your health care provider.  It is important to eat a healthy diet and maintain a healthy weight. ? Be sure to include plenty of vegetables, fruits, low-fat dairy products, and lean protein. ? Avoid eating foods that are high in solid fats, added sugars, or salt (sodium).  Get regular exercise. This is one of the most important things that you can do for your health. ? Try to exercise for at least 150 minutes each week. The type of exercise that you do should increase your heart rate and make you sweat. This is known as moderate-intensity exercise. ? Try to do strengthening exercises at least twice each week. Do these in addition to the moderate-intensity exercise.  Know your numbers.Ask your health care provider to check your cholesterol and your blood glucose. Continue to have your blood tested as directed by your health care provider.  What should I know about  cancer screening? There are several types of cancer. Take the following steps to reduce your risk and to catch any cancer development as early as possible. Breast Cancer  Practice breast self-awareness. ? This means understanding how your breasts normally appear and feel. ? It also means doing regular breast  self-exams. Let your health care provider know about any changes, no matter how small.  If you are 56 or older, have a clinician do a breast exam (clinical breast exam or CBE) every year. Depending on your age, family history, and medical history, it may be recommended that you also have a yearly breast X-ray (mammogram).  If you have a family history of breast cancer, talk with your health care provider about genetic screening.  If you are at high risk for breast cancer, talk with your health care provider about having an MRI and a mammogram every year.  Breast cancer (BRCA) gene test is recommended for women who have family members with BRCA-related cancers. Results of the assessment will determine the need for genetic counseling and BRCA1 and for BRCA2 testing. BRCA-related cancers include these types: ? Breast. This occurs in males or females. ? Ovarian. ? Tubal. This may also be called fallopian tube cancer. ? Cancer of the abdominal or pelvic lining (peritoneal cancer). ? Prostate. ? Pancreatic.  Cervical, Uterine, and Ovarian Cancer Your health care provider may recommend that you be screened regularly for cancer of the pelvic organs. These include your ovaries, uterus, and vagina. This screening involves a pelvic exam, which includes checking for microscopic changes to the surface of your cervix (Pap test).  For women ages 21-65, health care providers may recommend a pelvic exam and a Pap test every three years. For women ages 71-65, they may recommend the Pap test and pelvic exam, combined with testing for human papilloma virus (HPV), every five years. Some types of HPV increase your risk of cervical cancer. Testing for HPV may also be done on women of any age who have unclear Pap test results.  Other health care providers may not recommend any screening for nonpregnant women who are considered low risk for pelvic cancer and have no symptoms. Ask your health care provider if a screening  pelvic exam is right for you.  If you have had past treatment for cervical cancer or a condition that could lead to cancer, you need Pap tests and screening for cancer for at least 20 years after your treatment. If Pap tests have been discontinued for you, your risk factors (such as having a new sexual partner) need to be reassessed to determine if you should start having screenings again. Some women have medical problems that increase the chance of getting cervical cancer. In these cases, your health care provider may recommend that you have screening and Pap tests more often.  If you have a family history of uterine cancer or ovarian cancer, talk with your health care provider about genetic screening.  If you have vaginal bleeding after reaching menopause, tell your health care provider.  There are currently no reliable tests available to screen for ovarian cancer.  Lung Cancer Lung cancer screening is recommended for adults 59-9 years old who are at high risk for lung cancer because of a history of smoking. A yearly low-dose CT scan of the lungs is recommended if you:  Currently smoke.  Have a history of at least 30 pack-years of smoking and you currently smoke or have quit within the past 15 years. A pack-year is  smoking an average of one pack of cigarettes per day for one year.  Yearly screening should:  Continue until it has been 15 years since you quit.  Stop if you develop a health problem that would prevent you from having lung cancer treatment.  Colorectal Cancer  This type of cancer can be detected and can often be prevented.  Routine colorectal cancer screening usually begins at age 85 and continues through age 54.  If you have risk factors for colon cancer, your health care provider may recommend that you be screened at an earlier age.  If you have a family history of colorectal cancer, talk with your health care provider about genetic screening.  Your health care  provider may also recommend using home test kits to check for hidden blood in your stool.  A small camera at the end of a tube can be used to examine your colon directly (sigmoidoscopy or colonoscopy). This is done to check for the earliest forms of colorectal cancer.  Direct examination of the colon should be repeated every 5-10 years until age 30. However, if early forms of precancerous polyps or small growths are found or if you have a family history or genetic risk for colorectal cancer, you may need to be screened more often.  Skin Cancer  Check your skin from head to toe regularly.  Monitor any moles. Be sure to tell your health care provider: ? About any new moles or changes in moles, especially if there is a change in a mole's shape or color. ? If you have a mole that is larger than the size of a pencil eraser.  If any of your family members has a history of skin cancer, especially at a young age, talk with your health care provider about genetic screening.  Always use sunscreen. Apply sunscreen liberally and repeatedly throughout the day.  Whenever you are outside, protect yourself by wearing long sleeves, pants, a wide-brimmed hat, and sunglasses.  What should I know about osteoporosis? Osteoporosis is a condition in which bone destruction happens more quickly than new bone creation. After menopause, you may be at an increased risk for osteoporosis. To help prevent osteoporosis or the bone fractures that can happen because of osteoporosis, the following is recommended:  If you are 37-50 years old, get at least 1,000 mg of calcium and at least 600 mg of vitamin D per day.  If you are older than age 105 but younger than age 89, get at least 1,200 mg of calcium and at least 600 mg of vitamin D per day.  If you are older than age 49, get at least 1,200 mg of calcium and at least 800 mg of vitamin D per day.  Smoking and excessive alcohol intake increase the risk of osteoporosis. Eat  foods that are rich in calcium and vitamin D, and do weight-bearing exercises several times each week as directed by your health care provider. What should I know about how menopause affects my mental health? Depression may occur at any age, but it is more common as you become older. Common symptoms of depression include:  Low or sad mood.  Changes in sleep patterns.  Changes in appetite or eating patterns.  Feeling an overall lack of motivation or enjoyment of activities that you previously enjoyed.  Frequent crying spells.  Talk with your health care provider if you think that you are experiencing depression. What should I know about immunizations? It is important that you get and maintain  your immunizations. These include:  Tetanus, diphtheria, and pertussis (Tdap) booster vaccine.  Influenza every year before the flu season begins.  Pneumonia vaccine.  Shingles vaccine.  Your health care provider may also recommend other immunizations. This information is not intended to replace advice given to you by your health care provider. Make sure you discuss any questions you have with your health care provider. Document Released: 11/28/2005 Document Revised: 04/25/2016 Document Reviewed: 07/10/2015 Elsevier Interactive Patient Education  2018 [employer].

## 2017-09-01 LAB — HEPATITIS C ANTIBODY
HEP C AB: NONREACTIVE
SIGNAL TO CUT-OFF: 0.01 (ref <1.00)

## 2017-09-17 DIAGNOSIS — H40023 Open angle with borderline findings, high risk, bilateral: Secondary | ICD-10-CM | POA: Diagnosis not present

## 2017-09-17 DIAGNOSIS — H40033 Anatomical narrow angle, bilateral: Secondary | ICD-10-CM | POA: Diagnosis not present

## 2017-10-28 ENCOUNTER — Ambulatory Visit (HOSPITAL_BASED_OUTPATIENT_CLINIC_OR_DEPARTMENT_OTHER)
Admission: RE | Admit: 2017-10-28 | Discharge: 2017-10-28 | Disposition: A | Discharge status: Home / Self Care | Payer: PPO | Source: Ambulatory Visit | Attending: Family Medicine | Admitting: Family Medicine

## 2017-10-28 ENCOUNTER — Ambulatory Visit (INDEPENDENT_AMBULATORY_CARE_PROVIDER_SITE_OTHER): Payer: PPO | Admitting: Family Medicine

## 2017-10-28 ENCOUNTER — Encounter: Payer: Self-pay | Admitting: Family Medicine

## 2017-10-28 ENCOUNTER — Telehealth: Payer: Self-pay

## 2017-10-28 VITALS — BP 132/82 | HR 82 | Temp 98.3°F | Ht 65.5 in | Wt 165.2 lb

## 2017-10-28 DIAGNOSIS — M47812 Spondylosis without myelopathy or radiculopathy, cervical region: Secondary | ICD-10-CM | POA: Insufficient documentation

## 2017-10-28 DIAGNOSIS — R202 Paresthesia of skin: Secondary | ICD-10-CM | POA: Diagnosis not present

## 2017-10-28 DIAGNOSIS — J984 Other disorders of lung: Secondary | ICD-10-CM

## 2017-10-28 DIAGNOSIS — M79601 Pain in right arm: Secondary | ICD-10-CM

## 2017-10-28 DIAGNOSIS — R918 Other nonspecific abnormal finding of lung field: Secondary | ICD-10-CM | POA: Diagnosis not present

## 2017-10-28 DIAGNOSIS — I700 Atherosclerosis of aorta: Secondary | ICD-10-CM | POA: Diagnosis not present

## 2017-10-28 DIAGNOSIS — M549 Dorsalgia, unspecified: Secondary | ICD-10-CM | POA: Diagnosis not present

## 2017-10-28 DIAGNOSIS — R0781 Pleurodynia: Secondary | ICD-10-CM | POA: Diagnosis not present

## 2017-10-28 MED ORDER — CYCLOBENZAPRINE HCL 10 MG PO TABS: ORAL_TABLET | ORAL | 0 refills | Status: DC

## 2017-10-28 NOTE — Telephone Encounter (Signed)
PA initiated via Covermymeds; KEY: [id number]. Awaiting determination.

## 2017-10-28 NOTE — Patient Instructions (Signed)
Please go to the imaging dept on the ground floor, then you can go home.  I will be in touch with your x-ray reports asap! Try the flexeril as needed for pain- remember this can make you sleepy however

## 2017-10-28 NOTE — Progress Notes (Signed)
[place] at [facility] [address] [place], [place] [address] [phone]  Date:  10/28/2017   Name:  Danielle Daniel   DOB:  [birth year]   MRN:  [id number]  PCP:  Darreld Mclean, MD    Chief Complaint: Shoulder Pain (c/o pain and burning in right shoulder that radiates down arm. Pt states that she also has pain in her right thumb. Would like to have xray done. )   History of Present Illness:  Danielle Daniel is a 67 y.o. very pleasant female patient who presents with the following:  From our last visit in November of 2018:  History of GERD, cataracts, hyperlipidemia controlled with niacin States that she is basically healthy,  I only come to the doctor once a year She is going to a PT at [place] right now for a burning pain in her right shoulder.  The PT helps for a couple of days, but then it will come back.  She has been doing PT and trying to follow all their recommendations for a month or so. 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 She tried to use a treadmill but it bothered her knees.  She tried orange theory but it was too intense for her. She plans to try silver sneakers instead.   She was sent to PT for her right shoulder at some point by ?ortho. She is not really sure who her doctor was She notes a burning pain going donn her right arm all the way down to the thenar eminence The left side is ok PT wondered if this might be coming from her neck She has not noted any neck pain She has noted this problem for 3 months She is using advil as needed It will come and go She jointed [employer] and tried to do some floor exercises -this exacerbated her pain She has pain if her arm is in flexion for a long period, so she puts her computer mouse on a  chair next to her so it is lower  She does not notice any significant weakness or numbness on the right, more the burning pain   Patient Active Problem List   Diagnosis Date Noted  . Eustachian tube dysfunction 10/10/2014  . Lymphadenopathy 10/10/2014  . Trapezius muscle spasm 05/19/2013  . Screening for malignant neoplasm of cervix 02/27/2012  . Routine general medical examination at a health care facility 02/27/2012  . VISUAL CHANGES 12/20/2010  . GERD 11/07/2010  . OTH SYMPTOMS INVOLVING SKIN&INTEG TISSUES 11/07/2010  . DYSPHAGIA 11/07/2010  . CONSTIPATION, CHRONIC 12/18/2009  . HAND SPRAIN 10/27/2007    Past Medical History:  Diagnosis Date  . Cataract    left eye  . Constipation   . GERD (gastroesophageal reflux disease)    zantac prn  . HSV (herpes simplex virus) infection   . Hyperlipidemia    tx red yeast rice - now normal per patient    Past Surgical History:  Procedure Laterality Date  . APPENDECTOMY    . CATARACT EXTRACTION Left 04/2016   [facility]  . EXPLORATORY LAPAROTOMY    . RETINAL DETACHMENT SURGERY    . VITRECTOMY Left   . VITRECTOMY AND CATARACT Left   . Schulter EXTRACTION      Social History  Tobacco Use  . Smoking status: Never Smoker  . Smokeless tobacco: Never Used  Substance Use Topics  . Alcohol use: Yes    Comment: occasionally beer/wine  . Drug use: No    Family History  Problem Relation Age of Onset  . Heart disease Mother        CHF  . Heart disease Sister        arrythmia  . Hypertension Sister   . Heart disease Sister        arrythmia  . Hypertension Sister   . Colon cancer Neg Hx   . Esophageal cancer Neg Hx   . Rectal cancer Neg Hx   . Stomach cancer Neg Hx     Allergies  Allergen Reactions  . Cortisone Rash    Medication list has been reviewed and updated.  Current Outpatient Medications on File Prior to Visit  Medication Sig Dispense Refill  . Ascorbic Acid (VITAMIN C) 1000 MG tablet Take 1,000 mg by  mouth daily.    . Calcium Carbonate Antacid (TUMS PO) Take 2 tablets by mouth at bedtime.    . Calcium-Magnesium-Vitamin D [id number] MG-MG-UNIT TB24 Take 1 tablet by mouth daily.    Marland Kitchen docusate sodium (COLACE) 100 MG capsule Take 100 mg daily by mouth.    . Inositol Niacinate (NIACIN FLUSH FREE) 500 MG CAPS Take 1 capsule by mouth daily.    . Multiple Vitamin (MULTI-VITAMIN PO) Take 1 tablet by mouth daily.    . Red Yeast Rice 600 MG CAPS Take 2 capsules by mouth daily.     . valACYclovir (VALTREX) 1000 MG tablet TAKE ONE TABLET BY MOUTH EVERY DAY (Patient taking differently: TAKE ONE-HALF TABLET BY MOUTH AS NEEDED) 30 tablet 2   No current facility-administered medications on file prior to visit.     Review of Systems:  As per HPI- otherwise negative.   Physical Examination: Vitals:   10/28/17 1310  BP: 132/82  Pulse: 82  Temp: 98.3 F (36.8 C)  SpO2: 98%   Vitals:   10/28/17 1310  Weight: 165 lb 3.2 oz (74.9 kg)   Body mass index is 27.07 kg/m. Ideal Body Weight:    GEN: WDWN, NAD, Non-toxic, A & O x 3, mild overweight, looks well HEENT: Atraumatic, Normocephalic. Neck supple. No masses, No LAD. Ears and Nose: No external deformity. CV: RRR, No M/G/R. No JVD. No thrill. No extra heart sounds. PULM: CTA B, no wheezes, crackles, rhonchi. No retractions. No resp. distress. No accessory muscle use. ABD: S, NT, ND, +BS. No rebound. No HSM. EXTR: No c/c/e NEURO Normal gait.  PSYCH: Normally interactive. Conversant. Not depressed or anxious appearing.  Calm demeanor.  Normal cervical ROM, no pain with neck movement Normal exam of right upper limb Normal strength and DTR of right arm She notes the pain seems to come from the right axilla and run down the right arm to the thumb Axilla does not display any rash of LAD  Assessment and Plan: Right arm pain - Plan: DG Cervical Spine Complete, DG Thoracic Spine 2 View, DG Ribs Unilateral W/Chest Right, cyclobenzaprine (FLEXERIL)  10 MG tablet  Paresthesias in right hand - Plan: DG Cervical Spine Complete, DG Thoracic Spine 2 View, DG Ribs Unilateral W/Chest Right, cyclobenzaprine (FLEXERIL) 10 MG tablet  Here today with pain and burning down the right arm She wonders about a nerve impingement at her neck  We discussed prednisone, but she reports getting an all over rash after a cortisone injection  in the past.  We decided to use flexeril as needed instead   Signed Lamar Blinks, MD  Received her x-ray reports:  Dg Ribs Unilateral W/chest Right  Result Date: 10/28/2017 CLINICAL DATA:  Right axillary pain radiating to the upper back. EXAM: RIGHT RIBS AND CHEST - 3+ VIEW COMPARISON:  None. FINDINGS: Cardiomediastinal silhouette is normal. Mediastinal contours appear intact. Mild calcific atherosclerotic disease of the aorta. There is no evidence of focal airspace consolidation, pleural effusion or pneumothorax. Biapical pleural/subpleural thickening. Osseous structures are without acute abnormality. Soft tissues are grossly normal. IMPRESSION: Biapical pleural/subpleural thickening usually representing scarring. No evidence of rib fractures. Mild calcific atherosclerotic disease of the aorta. Electronically Signed   By: Fidela Salisbury M.D.   On: 10/28/2017 19:11   Dg Cervical Spine Complete  Result Date: 10/28/2017 CLINICAL DATA:  Right axillary pain and right upper back pain. EXAM: CERVICAL SPINE - COMPLETE 4+ VIEW COMPARISON:  None. FINDINGS: There is straightening of the cervical lordosis. No evidence of fracture. Multilevel osteoarthritic changes, particularly prominent at C4-C5, C5-C6 and C6-C7 with disc space narrowing, degenerative vertebral body remodeling and osteophyte formation. Associated Luschka joint arthropathy, most severe at C6-C7. Subsequent mild bilateral osseous neural foraminal narrowing at C4-C5, C5-C6 and C6-C7. IMPRESSION: Multilevel osteoarthritic changes, moderate to severe at C4-C5, C5-C6 and  C6-C7, with mild bilateral osseous neural foraminal narrowing. Electronically Signed   By: Fidela Salisbury M.D.   On: 10/28/2017 18:30   Dg Thoracic Spine 2 View  Result Date: 10/28/2017 CLINICAL DATA:  Right axillary pain which radiates to the upper back. EXAM: THORACIC SPINE 2 VIEWS COMPARISON:  None. FINDINGS: There is no evidence of thoracic spine fracture. Alignment is normal. No other significant bone abnormalities are identified. IMPRESSION: Negative. Electronically Signed   By: Fidela Salisbury M.D.   On: 10/28/2017 19:10    Message to pt   Your rib films are normal. There is some scarring in the tops of the lungs, likely due to past illness.  Mild calcium build- up in the aorta Thoracic spine is normal Your cervical spine (neck) does show significant degenerative change which may be causing nerve impingement and thus the symptoms down your right arm.  Since we are not able to treat you with oral steroids, we may want to have you go ahead and see a spine doctor.  Would you like me to make this referral for you?

## 2017-10-30 NOTE — Telephone Encounter (Signed)
PA Case: [id number], Status: Approved, Coverage Starts on: 10/28/2017 12:00:00 AM, Coverage Ends on: 10/19/2018 12:00:00 AM.

## 2017-12-22 NOTE — Progress Notes (Signed)
[place] [facility] [address], [place], [place] [address] [phone]  Date:  12/23/2017   Name:  Danielle Daniel   DOB:  03-05-1951   MRN:  [id number]  PCP:  Darreld Mclean, MD    Chief Complaint: Follow-up (c/o elevated bp readings at home. I checked her bp manually today and it was 142/82, next we used her machine she brought from home and the reading was 170/102. )   History of Present Illness:  Danielle Daniel is a 67 y.o. very pleasant female patient who presents with the following:  Here today with concern of BP being elevated.  I had mentioned that her BP should be less than 140/90 at her last visit and she then started checking her BP over the last week She has been using an old home BP meter- she was running high and got worried  She has had some normal readings over the last week and some approx 160/110  BP Readings from Last 3 Encounters:  12/23/17 (!) 142/82  10/28/17 132/82  08/31/17 138/82   On her home machine she has been getting some unusual numbers- per my CNA note: c/o elevated bp readings at home. I checked her bp manually today and it was 142/82, next we used her machine she brought from home and the reading was 170/102.    She also has a question about recent lung findings- explained that we cannot tell what may have caused scarring of her lungs, but this is likely nothing of concern.  We plan to do a CXR for her this summer which is already ordered for her     IMPRESSION: Biapical pleural/subpleural thickening usually representing scarring. No evidence of rib fractures. Mild calcific atherosclerotic disease of the aorta.  She is otherwise feeling well and does not have any concerns  Patient Active Problem List   Diagnosis Date Noted  . Eustachian tube dysfunction 10/10/2014  . Lymphadenopathy 10/10/2014  . Trapezius muscle spasm 05/19/2013  . Screening for malignant neoplasm of cervix  02/27/2012  . Routine general medical examination at a health care facility 02/27/2012  . VISUAL CHANGES 12/20/2010  . GERD 11/07/2010  . OTH SYMPTOMS INVOLVING SKIN&INTEG TISSUES 11/07/2010  . DYSPHAGIA 11/07/2010  . CONSTIPATION, CHRONIC 12/18/2009  . HAND SPRAIN 10/27/2007    Past Medical History:  Diagnosis Date  . Cataract    left eye  . Constipation   . GERD (gastroesophageal reflux disease)    zantac prn  . HSV (herpes simplex virus) infection   . Hyperlipidemia    tx red yeast rice - now normal per patient    Past Surgical History:  Procedure Laterality Date  . APPENDECTOMY    . CATARACT EXTRACTION Left 04/2016   [facility]  . EXPLORATORY LAPAROTOMY    . RETINAL DETACHMENT SURGERY    . VITRECTOMY Left   . VITRECTOMY AND CATARACT Left   . WISDOM TOOTH EXTRACTION      Social History   Tobacco Use  . Smoking status: Never Smoker  . Smokeless tobacco: Never Used  Substance Use Topics  . Alcohol use: Yes    Comment: occasionally beer/wine  . Drug use: No    Family History  Problem Relation Age of Onset  . Heart disease Mother        CHF  . Heart disease Sister        arrythmia  . Hypertension Sister   .  Heart disease Sister        arrythmia  . Hypertension Sister   . Colon cancer Neg Hx   . Esophageal cancer Neg Hx   . Rectal cancer Neg Hx   . Stomach cancer Neg Hx     Allergies  Allergen Reactions  . Cortisone Rash    Medication list has been reviewed and updated.  Current Outpatient Medications on File Prior to Visit  Medication Sig Dispense Refill  . Ascorbic Acid (VITAMIN C) 1000 MG tablet Take 1,000 mg by mouth daily.    . Calcium Carbonate Antacid (TUMS PO) Take 2 tablets by mouth at bedtime.    . Calcium-Magnesium-Vitamin D [id number] MG-MG-UNIT TB24 Take 1 tablet by mouth daily.    . cyclobenzaprine (FLEXERIL) 10 MG tablet Take 1/2 or 1 at bedtime as needed for pain 20 tablet 0  . docusate sodium (COLACE) 100 MG capsule Take 100 mg  daily by mouth.    . Inositol Niacinate (NIACIN FLUSH FREE) 500 MG CAPS Take 1 capsule by mouth daily.    . Multiple Vitamin (MULTI-VITAMIN PO) Take 1 tablet by mouth daily.    . Red Yeast Rice 600 MG CAPS Take 2 capsules by mouth daily.     . valACYclovir (VALTREX) 1000 MG tablet TAKE ONE TABLET BY MOUTH EVERY DAY (Patient taking differently: TAKE ONE-HALF TABLET BY MOUTH AS NEEDED) 30 tablet 2   No current facility-administered medications on file prior to visit.     Review of Systems:  As per HPI- otherwise negative. No fever or chills   Physical Examination: Vitals:   12/23/17 1030  BP: (!) 142/82  Pulse: 75  Temp: (!) 97.3 F (36.3 C)  SpO2: 98%   Vitals:   12/23/17 1030  Weight: 164 lb 3.2 oz (74.5 kg)  Height: 5' 5.5" (1.664 m)   Body mass index is 26.91 kg/m. Ideal Body Weight: Weight in (lb) to have BMI = 25: 152.2  GEN: WDWN, NAD, Non-toxic, A & O x 3 HEENT: Atraumatic, Normocephalic. Neck supple. No masses, No LAD. Ears and Nose: No external deformity. CV: RRR, No M/G/R. No JVD. No thrill. No extra heart sounds. PULM: CTA B, no wheezes, crackles, rhonchi. No retractions. No resp. distress. No accessory muscle use. EXTR: No c/c/e NEURO Normal gait.  PSYCH: Normally interactive. Conversant. Not depressed or anxious appearing.  Calm demeanor.    Assessment and Plan: Blood pressure check  Concern about high BP at home- however, we suspect that her cuff may not be accurate  Please get a new BP meter and check your BP at most a few times a week. If you are consistently running greater than 140/90 please alert me and I will rx a low dose of a BP med for you. Continue to work on salt reduction, and try to avoid stress as much as you can  We will plan to do a CXR for you this summer to check on the findings from [place], [place]

## 2017-12-23 ENCOUNTER — Ambulatory Visit (INDEPENDENT_AMBULATORY_CARE_PROVIDER_SITE_OTHER): Payer: PPO | Admitting: Family Medicine

## 2017-12-23 ENCOUNTER — Encounter: Payer: Self-pay | Admitting: Family Medicine

## 2017-12-23 VITALS — BP 142/82 | HR 75 | Temp 97.3°F | Ht 65.5 in | Wt 164.2 lb

## 2017-12-23 DIAGNOSIS — Z0130 Encounter for examination of blood pressure without abnormal findings: Secondary | ICD-10-CM | POA: Diagnosis not present

## 2017-12-23 NOTE — Patient Instructions (Addendum)
Please get a new BP meter and check your BP at most a few times a week. If you are consistently running greater than 140/90 please alert me and I will rx a low dose of a BP med for you. Continue to work on salt reduction, and try to avoid stress as much as you can  We will plan to do a CXR for you this summer to check on the findings from January

## 2018-01-13 DIAGNOSIS — M50122 Cervical disc disorder at C5-C6 level with radiculopathy: Secondary | ICD-10-CM | POA: Diagnosis not present

## 2018-01-13 DIAGNOSIS — M9902 Segmental and somatic dysfunction of thoracic region: Secondary | ICD-10-CM | POA: Diagnosis not present

## 2018-01-13 DIAGNOSIS — M62830 Muscle spasm of back: Secondary | ICD-10-CM | POA: Diagnosis not present

## 2018-01-13 DIAGNOSIS — M542 Cervicalgia: Secondary | ICD-10-CM | POA: Diagnosis not present

## 2018-01-13 DIAGNOSIS — M546 Pain in thoracic spine: Secondary | ICD-10-CM | POA: Diagnosis not present

## 2018-01-13 DIAGNOSIS — M25511 Pain in right shoulder: Secondary | ICD-10-CM | POA: Diagnosis not present

## 2018-01-13 DIAGNOSIS — M9901 Segmental and somatic dysfunction of cervical region: Secondary | ICD-10-CM | POA: Diagnosis not present

## 2018-01-22 DIAGNOSIS — M9902 Segmental and somatic dysfunction of thoracic region: Secondary | ICD-10-CM | POA: Diagnosis not present

## 2018-01-22 DIAGNOSIS — M25511 Pain in right shoulder: Secondary | ICD-10-CM | POA: Diagnosis not present

## 2018-01-22 DIAGNOSIS — M542 Cervicalgia: Secondary | ICD-10-CM | POA: Diagnosis not present

## 2018-01-22 DIAGNOSIS — M9901 Segmental and somatic dysfunction of cervical region: Secondary | ICD-10-CM | POA: Diagnosis not present

## 2018-01-22 DIAGNOSIS — M50122 Cervical disc disorder at C5-C6 level with radiculopathy: Secondary | ICD-10-CM | POA: Diagnosis not present

## 2018-01-22 DIAGNOSIS — M546 Pain in thoracic spine: Secondary | ICD-10-CM | POA: Diagnosis not present

## 2018-01-22 DIAGNOSIS — M62830 Muscle spasm of back: Secondary | ICD-10-CM | POA: Diagnosis not present

## 2018-02-03 ENCOUNTER — Encounter: Payer: Self-pay | Admitting: Family Medicine

## 2018-03-02 NOTE — Progress Notes (Addendum)
Subjective:   Danielle Daniel is a 67 y.o. female who presents for Medicare Annual (Subsequent) preventive examination. Still works full time as Danielle Daniel.  Review of Systems: No ROS.  Medicare Wellness Visit. Additional risk factors are reflected in the social history.  Cardiac Risk Factors include: advanced age (>44men, >54 women) Sleep patterns:  Sleeps well per pt.  Home Safety/Smoke Alarms: Feels safe in home. Smoke alarms in place.  Living environment; residence and Firearm Safety: Lives in 2 story home with cats.    Female:   Pap- 2016      Mammo- utd      Dexa scan- decline  CCS-utd. Due 2022  Objective:     Vitals: BP 136/74 (BP Location: Left Arm, Patient Position: Sitting, Cuff Size: Normal)   Pulse 72   Ht 5\' 6"  (1.676 m)   Wt 163 lb 3.2 oz (74 kg)   SpO2 98%   BMI 26.34 kg/m   Body mass index is 26.34 kg/m.  Advanced Directives 03/04/2018 08/28/2016 08/13/2016  Does Patient Have a Medical Advance Directive? Yes Yes Yes  Type of Paramedic of [place];Living will [place];Living will [place];Living will  Does patient want to make changes to medical advance directive? - No - Patient declined -  Copy of [place] in Chart? No - copy requested No - copy requested -    Tobacco Social History   Tobacco Use  Smoking Status Never Smoker  Smokeless Tobacco Never Used     Counseling given: Not Answered   Clinical Intake: Pain : No/denies pain     Past Medical History:  Diagnosis Date  . Cataract    left eye  . Constipation   . GERD (gastroesophageal reflux disease)    zantac prn  . HSV (herpes simplex virus) infection   . Hyperlipidemia    tx red yeast rice - now normal per patient   Past Surgical History:  Procedure Laterality Date  . APPENDECTOMY    . CATARACT EXTRACTION Left 04/2016   [facility]  . EXPLORATORY LAPAROTOMY    . RETINAL DETACHMENT  SURGERY    . VITRECTOMY Left   . VITRECTOMY AND CATARACT Left   . WISDOM TOOTH EXTRACTION     Family History  Problem Relation Age of Onset  . Heart disease Mother        CHF  . Heart disease Sister        arrythmia  . Hypertension Sister   . Heart disease Sister        arrythmia  . Hypertension Sister   . Colon cancer Neg Hx   . Esophageal cancer Neg Hx   . Rectal cancer Neg Hx   . Stomach cancer Neg Hx    Social History   Socioeconomic History  . Marital status: Single    Spouse name: Not on file  . Number of children: Not on file  . Years of education: Not on file  . Highest education level: Not on file  Occupational History  . Not on file  Social Needs  . Financial resource strain: Not on file  . Food insecurity:    Worry: Not on file    Inability: Not on file  . Transportation needs:    Medical: Not on file    Non-medical: Not on file  Tobacco Use  . Smoking status: Never Smoker  . Smokeless tobacco: Never Used  Substance and Sexual Activity  .  Alcohol use: Yes    Comment: occasionally beer/wine  . Drug use: No  . Sexual activity: Not Currently    Birth control/protection: Post-menopausal  Lifestyle  . Physical activity:    Days per week: Not on file    Minutes per session: Not on file  . Stress: Not on file  Relationships  . Social connections:    Talks on phone: Not on file    Gets together: Not on file    Attends religious service: Not on file    Active member of club or organization: Not on file    Attends meetings of clubs or organizations: Not on file    Relationship status: Not on file  Other Topics Concern  . Not on file  Social History Narrative  . Not on file    Outpatient Encounter Medications as of 03/04/2018  Medication Sig  . Ascorbic Acid (VITAMIN C) 1000 MG tablet Take 1,000 mg by mouth daily.  . Calcium Carbonate Antacid (TUMS PO) Take 2 tablets by mouth at bedtime.  . Calcium-Magnesium-Vitamin D [id number] MG-MG-UNIT TB24  Take 1 tablet by mouth daily.  . Coenzyme Q10 (COQ10 PO) Take by mouth.  . cyclobenzaprine (FLEXERIL) 10 MG tablet Take 1/2 or 1 at bedtime as needed for pain  . docusate sodium (COLACE) 100 MG capsule Take 100 mg daily by mouth.  . Inositol Niacinate (NIACIN FLUSH FREE) 500 MG CAPS Take 1 capsule by mouth daily.  . Multiple Vitamin (MULTI-VITAMIN PO) Take 1 tablet by mouth daily.  . ranitidine (ZANTAC) 75 MG tablet Take 75 mg by mouth daily as needed for heartburn.  . Red Yeast Rice 600 MG CAPS Take 2 capsules by mouth daily.   . valACYclovir (VALTREX) 1000 MG tablet TAKE ONE TABLET BY MOUTH EVERY DAY (Patient taking differently: TAKE ONE-HALF TABLET BY MOUTH AS NEEDED)   No facility-administered encounter medications on file as of 03/04/2018.     Activities of Daily Living In your present state of health, do you have any difficulty performing the following activities: 03/04/2018  Hearing? N  Vision? N  Comment eye doctor Dr. Herbert Daniel every 6 months  Difficulty concentrating or making decisions? N  Walking or climbing stairs? N  Dressing or bathing? N  Doing errands, shopping? N  Preparing Food and eating ? N  Using the Toilet? N  In the past six months, have you accidently leaked urine? N  Do you have problems with loss of bowel control? N  Managing your Medications? N  Managing your Finances? N  Housekeeping or managing your Housekeeping? N  Some recent data might be hidden    Patient Care Team: Danielle Daniel, Danielle Filler, MD as PCP - General (Family Medicine) Danielle Stalls, MD as Consulting Physician (Ophthalmology) Danielle Daniel, Danielle Raspberry, MD as Consulting Physician (Gastroenterology) Danielle Fam, MD as Consulting Physician (Ophthalmology)    Assessment:   This is a routine wellness examination for Danielle Daniel. Physical assessment deferred to PCP.  Exercise Activities and Dietary recommendations Current Exercise Habits: Structured exercise class, Type of exercise: calisthenics, Time  (Minutes): 60, Frequency (Times/Week): 1, Weekly Exercise (Minutes/Week): 60 Diet (meal preparation, eat out, water intake, caffeinated beverages, dairy products, fruits and vegetables): 24 hr recall Breakfast:coffee Lunch: pb&J sandwich Dinner:  Fish sandwich     Goals    . Find natural cure for GERD (pt-stated)       Fall Risk Fall Risk  03/04/2018 08/28/2016 08/27/2015 06/09/2014  Falls in the past year? No Yes No No  Depression Screen PHQ 2/9 Scores 03/04/2018 08/28/2016 08/27/2015 06/09/2014  PHQ - 2 Score 0 0 0 0     Cognitive Function Ad8 score reviewed for issues:  Issues making decisions:no  Less interest in hobbies / activities:no  Repeats questions, stories (family complaining):no  Trouble using ordinary gadgets (microwave, computer, phone):no  Forgets the month or year: no  Mismanaging finances: no  Remembering appts:no  Daily problems with thinking and/or memory:no Ad8 score is=0    MMSE - Mini Mental State Exam 08/28/2016  Orientation to time 5  Orientation to Place 5  Registration 3  Attention/ Calculation 5  Recall 3  Language- name 2 objects 2  Language- repeat 1  Language- follow 3 step command 3  Language- read & follow direction 1  Write a sentence 1  Copy design 1  Total score 30        Immunization History  Administered Date(s) Administered  . Td 10/20/1998, 12/12/2008    Screening Tests Health Maintenance  Topic Date Due  . DEXA SCAN  08/26/2017  . PNA vac Low Risk Adult (1 of 2 - PCV13) 08/31/2018 (Originally 05/15/2016)  . INFLUENZA VACCINE  05/20/2018  . MAMMOGRAM  06/25/2018  . TETANUS/TDAP  12/12/2018  . COLONOSCOPY  08/27/2021  . Hepatitis C Screening  Completed      Plan:    Follow up with Dr.Copland as directed  Please schedule your next medicare wellness visit with me in 1 yr.  Eat heart healthy diet (full of fruits, vegetables, whole grains, lean protein, water--limit salt, fat, and sugar intake) and increase  physical activity as tolerated.  Avoid spicy foods and eating late.  Bring a copy of your living will and/or healthcare power of attorney to your next office visit.   I have personally reviewed and noted the following in the patient's chart:   . Medical and social history . Use of alcohol, tobacco or illicit drugs  . Current medications and supplements . Functional ability and status . Nutritional status . Physical activity . Advanced directives . List of other physicians . Hospitalizations, surgeries, and ER visits in previous 12 months . Vitals . Screenings to include cognitive, depression, and falls . Referrals and appointments  In addition, I have reviewed and discussed with patient certain preventive protocols, quality metrics, and best practice recommendations. A written personalized care plan for preventive services as well as general preventive health recommendations were provided to patient.     Shela Nevin, RN  03/04/2018    Reviewed   Ann Held, DO

## 2018-03-04 ENCOUNTER — Ambulatory Visit (INDEPENDENT_AMBULATORY_CARE_PROVIDER_SITE_OTHER): Payer: PPO | Admitting: *Deleted

## 2018-03-04 ENCOUNTER — Encounter: Payer: Self-pay | Admitting: *Deleted

## 2018-03-04 VITALS — BP 136/74 | HR 72 | Ht 66.0 in | Wt 163.2 lb

## 2018-03-04 DIAGNOSIS — Z0000 Encounter for general adult medical examination without abnormal findings: Secondary | ICD-10-CM | POA: Diagnosis not present

## 2018-03-04 NOTE — Patient Instructions (Signed)
Follow up with Dr.Copland as directed  Please schedule your next medicare wellness visit with me in 1 yr.  Eat heart healthy diet (full of fruits, vegetables, whole grains, lean protein, water--limit salt, fat, and sugar intake) and increase physical activity as tolerated.  Avoid spicy foods and eating late.  Bring a copy of your living will and/or healthcare power of attorney to your next office visit.   Danielle Daniel , Thank you for taking time to come for your Medicare Wellness Visit. I appreciate your ongoing commitment to your health goals. Please review the following plan we discussed and let me know if I can assist you in the future.   These are the goals we discussed: Goals    . Find natural cure for GERD (pt-stated)       This is a list of the screening recommended for you and due dates:  Health Maintenance  Topic Date Due  . DEXA scan (bone density measurement)  08/26/2017  . Pneumonia vaccines (1 of 2 - PCV13) 08/31/2018*  . Flu Shot  05/20/2018  . Mammogram  06/25/2018  . Tetanus Vaccine  12/12/2018  . Colon Cancer Screening  08/27/2021  .  Hepatitis C: One time screening is recommended by Center for Disease Control  (CDC) for  adults born from [birth year] through 1965.   Completed  *Topic was postponed. The date shown is not the original due date.    Food Choices for Gastroesophageal Reflux Disease, Adult When you have gastroesophageal reflux disease (GERD), the foods you eat and your eating habits are very important. Choosing the right foods can help ease your discomfort. What guidelines do I need to follow?  Choose fruits, vegetables, whole grains, and low-fat dairy products.  Choose low-fat meat, fish, and poultry.  Limit fats such as oils, salad dressings, butter, nuts, and avocado.  Keep a food diary. This helps you identify foods that cause symptoms.  Avoid foods that cause symptoms. These may be different for everyone.  Eat small meals often instead of 3  large meals a day.  Eat your meals slowly, in a place where you are relaxed.  Limit fried foods.  Cook foods using methods other than frying.  Avoid drinking alcohol.  Avoid drinking large amounts of liquids with your meals.  Avoid bending over or lying down until 2-3 hours after eating. What foods are not recommended? These are some foods and drinks that may make your symptoms worse: Vegetables Tomatoes. Tomato juice. Tomato and spaghetti sauce. Chili peppers. Onion and garlic. Horseradish. Fruits Oranges, grapefruit, and lemon (fruit and juice). Meats High-fat meats, fish, and poultry. This includes hot dogs, ribs, ham, sausage, salami, and bacon. Dairy Whole milk and chocolate milk. Sour cream. Cream. Butter. Ice cream. Cream cheese. Drinks Coffee and tea. Bubbly (carbonated) drinks or energy drinks. Condiments Hot sauce. Barbecue sauce. Sweets/Desserts Chocolate and cocoa. Donuts. Peppermint and spearmint. Fats and Oils High-fat foods. This includes [place] fries and potato chips. Other Vinegar. Strong spices. This includes black pepper, white pepper, red pepper, cayenne, curry powder, cloves, ginger, and chili powder. The items listed above may not be a complete list of foods and drinks to avoid. Contact your dietitian for more information. This information is not intended to replace advice given to you by your health care provider. Make sure you discuss any questions you have with your health care provider. Document Released: 04/06/2012 Document Revised: 03/13/2016 Document Reviewed: 08/10/2013 Elsevier Interactive Patient Education  2017 [employer].

## 2018-03-08 DIAGNOSIS — H40023 Open angle with borderline findings, high risk, bilateral: Secondary | ICD-10-CM | POA: Diagnosis not present

## 2018-03-08 DIAGNOSIS — H35033 Hypertensive retinopathy, bilateral: Secondary | ICD-10-CM | POA: Diagnosis not present

## 2018-03-08 DIAGNOSIS — H35373 Puckering of macula, bilateral: Secondary | ICD-10-CM | POA: Diagnosis not present

## 2018-03-08 DIAGNOSIS — H40033 Anatomical narrow angle, bilateral: Secondary | ICD-10-CM | POA: Diagnosis not present

## 2018-07-29 ENCOUNTER — Other Ambulatory Visit: Payer: Self-pay | Admitting: Family Medicine

## 2018-07-29 DIAGNOSIS — Z1231 Encounter for screening mammogram for malignant neoplasm of breast: Secondary | ICD-10-CM

## 2018-08-31 ENCOUNTER — Ambulatory Visit
Admission: RE | Admit: 2018-08-31 | Discharge: 2018-08-31 | Disposition: A | Discharge status: Home / Self Care | Payer: PPO | Source: Ambulatory Visit | Attending: Family Medicine | Admitting: Family Medicine

## 2018-08-31 DIAGNOSIS — Z1231 Encounter for screening mammogram for malignant neoplasm of breast: Secondary | ICD-10-CM | POA: Diagnosis not present

## 2018-08-31 NOTE — Progress Notes (Addendum)
[facility] [place], [place], [place] [address] [phone]  Date:  09/02/2018   Name:  Danielle Daniel   DOB:  05/01/1951   MRN:  [id number]  PCP:  Danielle Mclean, MD    Chief Complaint: Annual Exam (no flu shot, no pap)   History of Present Illness:  Danielle Daniel is a 67 y.o. very pleasant female patient who presents with the following:  Here today for a physical History of GERD, hyperlipemia tx with red yeast rice  Last seen here in March with concern about her BP readings- which turned out to be likely ok  Pap: 11/16- tabori, normal HPV negative  - pt does not wish to do a pap today  Mammo:done this week, normal  Colon: 11/17 Dexa: 11/16- normal. Will order for her  Labs: 1 year ago-she is fasting today  Immun: flu- declines Shingrix- declines Tdap- declines  Pneumonia- declines   She notes that she has a history of GERD and was doing well on zantac. However when this was taken off the market recently she changed over to pepcid ac. This doe not seem to work as well however.  Zantac worked really well for her She is also using tums She notes that she has a lot of burping and phlegm.   pepcid ac Niacin OTC Red yeast rice OTC Patient Active Problem List   Diagnosis Date Noted  . Eustachian tube dysfunction 10/10/2014  . Lymphadenopathy 10/10/2014  . Trapezius muscle spasm 05/19/2013  . Screening for malignant neoplasm of cervix 02/27/2012  . Routine general medical examination at a health care facility 02/27/2012  . VISUAL CHANGES 12/20/2010  . GERD 11/07/2010  . OTH SYMPTOMS INVOLVING SKIN&INTEG TISSUES 11/07/2010  . DYSPHAGIA 11/07/2010  . CONSTIPATION, CHRONIC 12/18/2009  . HAND SPRAIN 10/27/2007    Past Medical History:  Diagnosis Date  . Cataract    left eye  . Constipation   . GERD (gastroesophageal reflux disease)    zantac prn  . HSV (herpes simplex virus) infection   .  Hyperlipidemia    tx red yeast rice - now normal per patient    Past Surgical History:  Procedure Laterality Date  . APPENDECTOMY    . CATARACT EXTRACTION Left 04/2016   [facility]  . EXPLORATORY LAPAROTOMY    . RETINAL DETACHMENT SURGERY    . VITRECTOMY Left   . VITRECTOMY AND CATARACT Left   . WISDOM TOOTH EXTRACTION      Social History   Tobacco Use  . Smoking status: Never Smoker  . Smokeless tobacco: Never Used  Substance Use Topics  . Alcohol use: Yes    Comment: occasionally beer/wine  . Drug use: No    Family History  Problem Relation Age of Onset  . Heart disease Mother        CHF  . Heart disease Sister        arrythmia  . Hypertension Sister   . Heart disease Sister        arrythmia  . Hypertension Sister   . Colon cancer Neg Hx   . Esophageal cancer Neg Hx   . Rectal cancer Neg Hx   . Stomach cancer Neg Hx   . Breast cancer Neg Hx     Allergies  Allergen Reactions  . Cortisone Rash    Medication list has been reviewed and updated.  Current Outpatient Medications on File Prior to Visit  Medication Sig Dispense Refill  . Ascorbic Acid (VITAMIN C) 1000 MG tablet Take 1,000 mg by mouth daily.    . Calcium Carbonate Antacid (TUMS PO) Take 2 tablets by mouth at bedtime.    . Calcium-Magnesium-Vitamin D [id number] MG-MG-UNIT TB24 Take 1 tablet by mouth daily.    . Coenzyme Q10 (COQ10 PO) Take by mouth.    . docusate sodium (COLACE) 100 MG capsule Take 100 mg daily by mouth.    . famotidine (PEPCID AC MAXIMUM STRENGTH) 20 MG tablet Take 20 mg by mouth 2 (two) times daily.    . Inositol Niacinate (NIACIN FLUSH FREE) 500 MG CAPS Take 1 capsule by mouth daily.    . Multiple Vitamin (MULTI-VITAMIN PO) Take 1 tablet by mouth daily.    . Red Yeast Rice 600 MG CAPS Take 2 capsules by mouth daily.     . valACYclovir (VALTREX) 1000 MG tablet TAKE ONE TABLET BY MOUTH EVERY DAY (Patient taking differently: TAKE ONE-HALF TABLET BY MOUTH AS NEEDED) 30 tablet 2   No  current facility-administered medications on file prior to visit.     Review of Systems:  As per HPI- otherwise negative. No CP or SOB No PMB  Physical Examination: Vitals:   09/02/18 0826  BP: 140/80  Pulse: 78  Resp: 16  Temp: 97.9 F (36.6 C)  SpO2: 99%   Vitals:   09/02/18 0826  Weight: 161 lb (73 kg)  Height: 5\' 6"  (1.676 m)   Body mass index is 25.99 kg/m. Ideal Body Weight: Weight in (lb) to have BMI = 25: 154.6  GEN: WDWN, NAD, Non-toxic, A & O x 3,normal weight HEENT: Atraumatic, Normocephalic. Neck supple. No masses, No LAD. Bilateral TM wnl, oropharynx normal.  PEERL,EOMI.   Ears and Nose: No external deformity. CV: RRR, No M/G/R. No JVD. No thrill. No extra heart sounds. PULM: CTA B, no wheezes, crackles, rhonchi. No retractions. No resp. distress. No accessory muscle use. ABD: S, NT, ND, +BS. No rebound. No HSM. EXTR: No c/c/e NEURO Normal gait.  PSYCH: Normally interactive. Conversant. Not depressed or anxious appearing.  Calm demeanor.    Assessment and Plan: Physical exam  Blood pressure check  Screening for diabetes mellitus - Plan: Comprehensive metabolic panel, Hemoglobin A1c  Screening for deficiency anemia - Plan: CBC  Screening for hyperlipidemia - Plan: Lipid panel  Screening for cervical cancer  Gastroesophageal reflux disease, esophagitis presence not specified - Plan: H. pylori breath test, nizatidine (AXID) 150 MG capsule  Screening for osteoporosis - Plan: DG Bone Density  Estrogen deficiency - Plan: DG Bone Density  Following up for a CPE today No concerns except for GERD-she had to stop her zantac as it was removed from market H pylori test today, will have her try nizatidine if not too expensive  She declines all recommended immunizations dexa today Will plan further follow- up pending labs.   Signed Danielle Blinks, MD  Received her labs so far, message to pt Blood counts are normal Metabolic profile normal R4E-  average blood sugar- is normal  I calculated your 10 year risk of cardiovascular disease as below- under 10% which is good.  At this time I don't think we need to recommend an rx cholesterol medication unless you just wish to have one  Take care, let's visit in about 6 months  Results for orders placed or performed in visit on 09/02/18  CBC  Result Value Ref Range   WBC 7.9 4.0 - 10.5 K/uL  RBC 4.61 3.87 - 5.11 Mil/uL   Platelets 355.0 150.0 - 400.0 K/uL   Hemoglobin 15.0 12.0 - 15.0 g/dL   HCT 44.3 36.0 - 46.0 %   MCV 95.9 78.0 - 100.0 fl   MCHC 34.0 30.0 - 36.0 g/dL   RDW 12.9 11.5 - 15.5 %  Comprehensive metabolic panel  Result Value Ref Range   Sodium 140 135 - 145 mEq/L   Potassium 4.5 3.5 - 5.1 mEq/L   Chloride 103 96 - 112 mEq/L   CO2 30 19 - 32 mEq/L   Glucose, Bld 82 70 - 99 mg/dL   BUN 11 6 - 23 mg/dL   Creatinine, Ser 0.92 0.40 - 1.20 mg/dL   Total Bilirubin 0.5 0.2 - 1.2 mg/dL   Alkaline Phosphatase 75 39 - 117 U/L   AST 22 0 - 37 U/L   ALT 19 0 - 35 U/L   Total Protein 7.5 6.0 - 8.3 g/dL   Albumin 4.6 3.5 - 5.2 g/dL   Calcium 9.8 8.4 - 10.5 mg/dL   GFR 64.66 >60.00 mL/min  Hemoglobin A1c  Result Value Ref Range   Hgb A1c MFr Bld 5.6 4.6 - 6.5 %  Lipid panel  Result Value Ref Range   Cholesterol 225 (H) 0 - 200 mg/dL   Triglycerides 113.0 0.0 - 149.0 mg/dL   HDL 64.40 >39.00 mg/dL   VLDL 22.6 0.0 - 40.0 mg/dL   LDL Cholesterol 138 (H) 0 - 99 mg/dL   Total CHOL/HDL Ratio 3    NonHDL 160.60

## 2018-09-02 ENCOUNTER — Encounter: Payer: Self-pay | Admitting: Family Medicine

## 2018-09-02 ENCOUNTER — Ambulatory Visit (INDEPENDENT_AMBULATORY_CARE_PROVIDER_SITE_OTHER): Payer: PPO | Admitting: Family Medicine

## 2018-09-02 VITALS — BP 140/80 | HR 78 | Temp 97.9°F | Resp 16 | Ht 66.0 in | Wt 161.0 lb

## 2018-09-02 DIAGNOSIS — Z13820 Encounter for screening for osteoporosis: Secondary | ICD-10-CM

## 2018-09-02 DIAGNOSIS — Z0000 Encounter for general adult medical examination without abnormal findings: Secondary | ICD-10-CM | POA: Diagnosis not present

## 2018-09-02 DIAGNOSIS — Z130 Encounter for screening for diseases of the blood and blood-forming organs and certain disorders involving the immune mechanism: Secondary | ICD-10-CM | POA: Diagnosis not present

## 2018-09-02 DIAGNOSIS — Z124 Encounter for screening for malignant neoplasm of cervix: Secondary | ICD-10-CM | POA: Diagnosis not present

## 2018-09-02 DIAGNOSIS — Z13220 Encounter for screening for lipoid disorders: Secondary | ICD-10-CM | POA: Diagnosis not present

## 2018-09-02 DIAGNOSIS — K219 Gastro-esophageal reflux disease without esophagitis: Secondary | ICD-10-CM | POA: Diagnosis not present

## 2018-09-02 DIAGNOSIS — Z131 Encounter for screening for diabetes mellitus: Secondary | ICD-10-CM | POA: Diagnosis not present

## 2018-09-02 DIAGNOSIS — E2839 Other primary ovarian failure: Secondary | ICD-10-CM | POA: Diagnosis not present

## 2018-09-02 DIAGNOSIS — Z0130 Encounter for examination of blood pressure without abnormal findings: Secondary | ICD-10-CM | POA: Diagnosis not present

## 2018-09-02 LAB — COMPREHENSIVE METABOLIC PANEL
ALK PHOS: 75 U/L (ref 39–117)
ALT: 19 U/L (ref 0–35)
AST: 22 U/L (ref 0–37)
Albumin: 4.6 g/dL (ref 3.5–5.2)
BILIRUBIN TOTAL: 0.5 mg/dL (ref 0.2–1.2)
BUN: 11 mg/dL (ref 6–23)
CALCIUM: 9.8 mg/dL (ref 8.4–10.5)
CO2: 30 mEq/L (ref 19–32)
Chloride: 103 mEq/L (ref 96–112)
Creatinine, Ser: 0.92 mg/dL (ref 0.40–1.20)
GFR: 64.66 mL/min (ref >60.00)
Glucose, Bld: 82 mg/dL (ref 70–99)
POTASSIUM: 4.5 meq/L (ref 3.5–5.1)
Sodium: 140 mEq/L (ref 135–145)
TOTAL PROTEIN: 7.5 g/dL (ref 6.0–8.3)

## 2018-09-02 LAB — CBC
HEMATOCRIT: 44.3 % (ref 36.0–46.0)
Hemoglobin: 15 g/dL (ref 12.0–15.0)
MCHC: 34 g/dL (ref 30.0–36.0)
MCV: 95.9 fl (ref 78.0–100.0)
Platelets: 355 10*3/uL (ref 150.0–400.0)
RBC: 4.61 Mil/uL (ref 3.87–5.11)
RDW: 12.9 % (ref 11.5–15.5)
WBC: 7.9 10*3/uL (ref 4.0–10.5)

## 2018-09-02 LAB — LIPID PANEL
CHOLESTEROL: 225 mg/dL — AB (ref 0–200)
HDL: 64.4 mg/dL (ref >39.00)
LDL Cholesterol: 138 mg/dL — ABNORMAL HIGH (ref 0–99)
NonHDL: 160.6
TRIGLYCERIDES: 113 mg/dL (ref 0.0–149.0)
Total CHOL/HDL Ratio: 3
VLDL: 22.6 mg/dL (ref 0.0–40.0)

## 2018-09-02 LAB — HEMOGLOBIN A1C: HEMOGLOBIN A1C: 5.6 % (ref 4.6–6.5)

## 2018-09-02 MED ORDER — NIZATIDINE 150 MG PO CAPS: 150.0000 mg | ORAL_CAPSULE | Freq: Two times a day (BID) | ORAL | 3 refills | Status: DC

## 2018-09-02 NOTE — Patient Instructions (Addendum)
Good to see you today Your BP looks ok-we will continue to monitor this For your GERD,let's try axid (generic tizanidine 150) twice a day We will also do an H pylori test for you today- if this is positive we will treat it, and it may well reduce your GERD symptoms  You can go downstairs and have the bone density test after your labs today   Health Maintenance for Postmenopausal Women Menopause is a normal process in which your reproductive ability comes to an end. This process happens gradually over a span of months to years, usually between the ages of 13 and 70. Menopause is complete when you have missed 12 consecutive menstrual periods. It is important to talk with your health care provider about some of the most common conditions that affect postmenopausal women, such as heart disease, cancer, and bone loss (osteoporosis). Adopting a healthy lifestyle and getting preventive care can help to promote your health and wellness. Those actions can also lower your chances of developing some of these common conditions. What should I know about menopause? During menopause, you may experience a number of symptoms, such as:  Moderate-to-severe hot flashes.  Night sweats.  Decrease in sex drive.  Mood swings.  Headaches.  Tiredness.  Irritability.  Memory problems.  Insomnia.  Choosing to treat or not to treat menopausal changes is an individual decision that you make with your health care provider. What should I know about hormone replacement therapy and supplements? Hormone therapy products are effective for treating symptoms that are associated with menopause, such as hot flashes and night sweats. Hormone replacement carries certain risks, especially as you become older. If you are thinking about using estrogen or estrogen with progestin treatments, discuss the benefits and risks with your health care provider. What should I know about heart disease and stroke? Heart disease, heart  attack, and stroke become more likely as you age. This may be due, in part, to the hormonal changes that your body experiences during menopause. These can affect how your body processes dietary fats, triglycerides, and cholesterol. Heart attack and stroke are both medical emergencies. There are many things that you can do to help prevent heart disease and stroke:  Have your blood pressure checked at least every 1-2 years. High blood pressure causes heart disease and increases the risk of stroke.  If you are 16-64 years old, ask your health care provider if you should take aspirin to prevent a heart attack or a stroke.  Do not use any tobacco products, including cigarettes, chewing tobacco, or electronic cigarettes. If you need help quitting, ask your health care provider.  It is important to eat a healthy diet and maintain a healthy weight. ? Be sure to include plenty of vegetables, fruits, low-fat dairy products, and lean protein. ? Avoid eating foods that are high in solid fats, added sugars, or salt (sodium).  Get regular exercise. This is one of the most important things that you can do for your health. ? Try to exercise for at least 150 minutes each week. The type of exercise that you do should increase your heart rate and make you sweat. This is known as moderate-intensity exercise. ? Try to do strengthening exercises at least twice each week. Do these in addition to the moderate-intensity exercise.  Know your numbers.Ask your health care provider to check your cholesterol and your blood glucose. Continue to have your blood tested as directed by your health care provider.  What should I know about  cancer screening? There are several types of cancer. Take the following steps to reduce your risk and to catch any cancer development as early as possible. Breast Cancer  Practice breast self-awareness. ? This means understanding how your breasts normally appear and feel. ? It also means  doing regular breast self-exams. Let your health care provider know about any changes, no matter how small.  If you are 23 or older, have a clinician do a breast exam (clinical breast exam or CBE) every year. Depending on your age, family history, and medical history, it may be recommended that you also have a yearly breast X-ray (mammogram).  If you have a family history of breast cancer, talk with your health care provider about genetic screening.  If you are at high risk for breast cancer, talk with your health care provider about having an MRI and a mammogram every year.  Breast cancer (BRCA) gene test is recommended for women who have family members with BRCA-related cancers. Results of the assessment will determine the need for genetic counseling and BRCA1 and for BRCA2 testing. BRCA-related cancers include these types: ? Breast. This occurs in males or females. ? Ovarian. ? Tubal. This may also be called fallopian tube cancer. ? Cancer of the abdominal or pelvic lining (peritoneal cancer). ? Prostate. ? Pancreatic.  Cervical, Uterine, and Ovarian Cancer Your health care provider may recommend that you be screened regularly for cancer of the pelvic organs. These include your ovaries, uterus, and vagina. This screening involves a pelvic exam, which includes checking for microscopic changes to the surface of your cervix (Pap test).  For women ages 21-65, health care providers may recommend a pelvic exam and a Pap test every three years. For women ages 29-65, they may recommend the Pap test and pelvic exam, combined with testing for human papilloma virus (HPV), every five years. Some types of HPV increase your risk of cervical cancer. Testing for HPV may also be done on women of any age who have unclear Pap test results.  Other health care providers may not recommend any screening for nonpregnant women who are considered low risk for pelvic cancer and have no symptoms. Ask your health care  provider if a screening pelvic exam is right for you.  If you have had past treatment for cervical cancer or a condition that could lead to cancer, you need Pap tests and screening for cancer for at least 20 years after your treatment. If Pap tests have been discontinued for you, your risk factors (such as having a new sexual partner) need to be reassessed to determine if you should start having screenings again. Some women have medical problems that increase the chance of getting cervical cancer. In these cases, your health care provider may recommend that you have screening and Pap tests more often.  If you have a family history of uterine cancer or ovarian cancer, talk with your health care provider about genetic screening.  If you have vaginal bleeding after reaching menopause, tell your health care provider.  There are currently no reliable tests available to screen for ovarian cancer.  Lung Cancer Lung cancer screening is recommended for adults 68-69 years old who are at high risk for lung cancer because of a history of smoking. A yearly low-dose CT scan of the lungs is recommended if you:  Currently smoke.  Have a history of at least 30 pack-years of smoking and you currently smoke or have quit within the past 15 years. A pack-year is  smoking an average of one pack of cigarettes per day for one year.  Yearly screening should:  Continue until it has been 15 years since you quit.  Stop if you develop a health problem that would prevent you from having lung cancer treatment.  Colorectal Cancer  This type of cancer can be detected and can often be prevented.  Routine colorectal cancer screening usually begins at age 61 and continues through age 63.  If you have risk factors for colon cancer, your health care provider may recommend that you be screened at an earlier age.  If you have a family history of colorectal cancer, talk with your health care provider about genetic  screening.  Your health care provider may also recommend using home test kits to check for hidden blood in your stool.  A small camera at the end of a tube can be used to examine your colon directly (sigmoidoscopy or colonoscopy). This is done to check for the earliest forms of colorectal cancer.  Direct examination of the colon should be repeated every 5-10 years until age 91. However, if early forms of precancerous polyps or small growths are found or if you have a family history or genetic risk for colorectal cancer, you may need to be screened more often.  Skin Cancer  Check your skin from head to toe regularly.  Monitor any moles. Be sure to tell your health care provider: ? About any new moles or changes in moles, especially if there is a change in a mole's shape or color. ? If you have a mole that is larger than the size of a pencil eraser.  If any of your family members has a history of skin cancer, especially at a young age, talk with your health care provider about genetic screening.  Always use sunscreen. Apply sunscreen liberally and repeatedly throughout the day.  Whenever you are outside, protect yourself by wearing long sleeves, pants, a wide-brimmed hat, and sunglasses.  What should I know about osteoporosis? Osteoporosis is a condition in which bone destruction happens more quickly than new bone creation. After menopause, you may be at an increased risk for osteoporosis. To help prevent osteoporosis or the bone fractures that can happen because of osteoporosis, the following is recommended:  If you are 64-70 years old, get at least 1,000 mg of calcium and at least 600 mg of vitamin D per day.  If you are older than age 29 but younger than age 36, get at least 1,200 mg of calcium and at least 600 mg of vitamin D per day.  If you are older than age 72, get at least 1,200 mg of calcium and at least 800 mg of vitamin D per day.  Smoking and excessive alcohol intake  increase the risk of osteoporosis. Eat foods that are rich in calcium and vitamin D, and do weight-bearing exercises several times each week as directed by your health care provider. What should I know about how menopause affects my mental health? Depression may occur at any age, but it is more common as you become older. Common symptoms of depression include:  Low or sad mood.  Changes in sleep patterns.  Changes in appetite or eating patterns.  Feeling an overall lack of motivation or enjoyment of activities that you previously enjoyed.  Frequent crying spells.  Talk with your health care provider if you think that you are experiencing depression. What should I know about immunizations? It is important that you get and maintain  your immunizations. These include:  Tetanus, diphtheria, and pertussis (Tdap) booster vaccine.  Influenza every year before the flu season begins.  Pneumonia vaccine.  Shingles vaccine.  Your health care provider may also recommend other immunizations. This information is not intended to replace advice given to you by your health care provider. Make sure you discuss any questions you have with your health care provider. Document Released: 11/28/2005 Document Revised: 04/25/2016 Document Reviewed: 07/10/2015 Elsevier Interactive Patient Education  2018 [employer].

## 2018-09-03 LAB — H. PYLORI BREATH TEST: H. PYLORI BREATH TEST: NOT DETECTED

## 2018-09-06 MED ORDER — PANTOPRAZOLE SODIUM 40 MG PO TBEC: 40.0000 mg | DELAYED_RELEASE_TABLET | Freq: Every day | ORAL | 6 refills | Status: DC

## 2018-09-06 NOTE — Addendum Note (Signed)
Addended by: Lamar Blinks C on: 09/06/2018 02:57 PM   Modules accepted: Orders

## 2018-09-10 ENCOUNTER — Ambulatory Visit (HOSPITAL_BASED_OUTPATIENT_CLINIC_OR_DEPARTMENT_OTHER)
Admission: RE | Admit: 2018-09-10 | Discharge: 2018-09-10 | Disposition: A | Discharge status: Home / Self Care | Payer: PPO | Source: Ambulatory Visit | Attending: Family Medicine | Admitting: Family Medicine

## 2018-09-10 ENCOUNTER — Encounter: Payer: Self-pay | Admitting: Family Medicine

## 2018-09-10 DIAGNOSIS — E2839 Other primary ovarian failure: Secondary | ICD-10-CM | POA: Diagnosis not present

## 2018-09-10 DIAGNOSIS — Z780 Asymptomatic menopausal state: Secondary | ICD-10-CM | POA: Diagnosis not present

## 2018-09-10 DIAGNOSIS — Z13820 Encounter for screening for osteoporosis: Secondary | ICD-10-CM | POA: Diagnosis not present

## 2019-03-16 DIAGNOSIS — H35033 Hypertensive retinopathy, bilateral: Secondary | ICD-10-CM | POA: Diagnosis not present

## 2019-03-16 DIAGNOSIS — H35373 Puckering of macula, bilateral: Secondary | ICD-10-CM | POA: Diagnosis not present

## 2019-03-16 DIAGNOSIS — H43392 Other vitreous opacities, left eye: Secondary | ICD-10-CM | POA: Diagnosis not present

## 2019-03-16 DIAGNOSIS — H40023 Open angle with borderline findings, high risk, bilateral: Secondary | ICD-10-CM | POA: Diagnosis not present

## 2019-07-18 ENCOUNTER — Other Ambulatory Visit: Payer: Self-pay | Admitting: Family Medicine

## 2019-07-18 DIAGNOSIS — Z1231 Encounter for screening mammogram for malignant neoplasm of breast: Secondary | ICD-10-CM

## 2019-09-04 NOTE — Patient Instructions (Addendum)
It was great to see you again today, I will be in touch with your labs ASAP You got your tetanus booster today BP looks fine Work on getting back into an exercise program- perhaps walking/ riding your exercise bike   I would suggest that you get the shingles vaccine, called Shingrix, at your pharmacy   Health Maintenance After Age 68 After age 56, you are at a higher risk for certain long-term diseases and infections as well as injuries from falls. Falls are a major cause of broken bones and head injuries in people who are older than age 7. Getting regular preventive care can help to keep you healthy and well. Preventive care includes getting regular testing and making lifestyle changes as recommended by your health care provider. Talk with your health care provider about:  Which screenings and tests you should have. A screening is a test that checks for a disease when you have no symptoms.  A diet and exercise plan that is right for you. What should I know about screenings and tests to prevent falls? Screening and testing are the best ways to find a health problem early. Early diagnosis and treatment give you the best chance of managing medical conditions that are common after age 51. Certain conditions and lifestyle choices may make you more likely to have a fall. Your health care provider may recommend:  Regular vision checks. Poor vision and conditions such as cataracts can make you more likely to have a fall. If you wear glasses, make sure to get your prescription updated if your vision changes.  Medicine review. Work with your health care provider to regularly review all of the medicines you are taking, including over-the-counter medicines. Ask your health care provider about any side effects that may make you more likely to have a fall. Tell your health care provider if any medicines that you take make you feel dizzy or sleepy.  Osteoporosis screening. Osteoporosis is a condition that  causes the bones to get weaker. This can make the bones weak and cause them to break more easily.  Blood pressure screening. Blood pressure changes and medicines to control blood pressure can make you feel dizzy.  Strength and balance checks. Your health care provider may recommend certain tests to check your strength and balance while standing, walking, or changing positions.  Foot health exam. Foot pain and numbness, as well as not wearing proper footwear, can make you more likely to have a fall.  Depression screening. You may be more likely to have a fall if you have a fear of falling, feel emotionally low, or feel unable to do activities that you used to do.  Alcohol use screening. Using too much alcohol can affect your balance and may make you more likely to have a fall. What actions can I take to lower my risk of falls? General instructions  Talk with your health care provider about your risks for falling. Tell your health care provider if: ? You fall. Be sure to tell your health care provider about all falls, even ones that seem minor. ? You feel dizzy, sleepy, or off-balance.  Take over-the-counter and prescription medicines only as told by your health care provider. These include any supplements.  Eat a healthy diet and maintain a healthy weight. A healthy diet includes low-fat dairy products, low-fat (lean) meats, and fiber from whole grains, beans, and lots of fruits and vegetables. Home safety  Remove any tripping hazards, such as rugs, cords, and clutter.  Install safety equipment such as grab bars in bathrooms and safety rails on stairs.  Keep rooms and walkways well-lit. Activity   Follow a regular exercise program to stay fit. This will help you maintain your balance. Ask your health care provider what types of exercise are appropriate for you.  If you need a cane or walker, use it as recommended by your health care provider.  Wear supportive shoes that have nonskid  soles. Lifestyle  Do not drink alcohol if your health care provider tells you not to drink.  If you drink alcohol, limit how much you have: ? 0-1 drink a day for women. ? 0-2 drinks a day for men.  Be aware of how much alcohol is in your drink. In the [place] one drink equals one typical bottle of beer (12 oz), one-half glass of wine (5 oz), or one shot of hard liquor (1 oz).  Do not use any products that contain nicotine or tobacco, such as cigarettes and e-cigarettes. If you need help quitting, ask your health care provider. Summary  Having a healthy lifestyle and getting preventive care can help to protect your health and wellness after age 36.  Screening and testing are the best way to find a health problem early and help you avoid having a fall. Early diagnosis and treatment give you the best chance for managing medical conditions that are more common for people who are older than age 63.  Falls are a major cause of broken bones and head injuries in people who are older than age 16. Take precautions to prevent a fall at home.  Work with your health care provider to learn what changes you can make to improve your health and wellness and to prevent falls. This information is not intended to replace advice given to you by your health care provider. Make sure you discuss any questions you have with your health care provider. Document Released: 08/19/2017 Document Revised: 01/27/2019 Document Reviewed: 08/19/2017 Elsevier Patient Education  2020 [employer].

## 2019-09-04 NOTE — Progress Notes (Addendum)
[facility] [address] [place], [place], [place] [address] [id number] [phone]  Date:  09/07/2019   Name:  Danielle Daniel   DOB:  [birth year]   MRN:  [id number]  PCP:  Darreld Mclean, MD    Chief Complaint: Annual Exam   History of Present Illness:  Danielle Daniel is a 68 y.o. very pleasant female patient who presents with the following:  Here today for routine physical Patient with history of GERD, dyslipidemia treated with red yeast rice and niacin Last seen by myself about 1 year ago  Mammogram just completed- normal  Bone density up-to-date Colon cancer screen up-to-date Flu shot- pt declines She has not yet had her pneumonia series, also can suggest tetanus and shingles vaccine She declines pneumonia, but is willing to have a tetanus booster  Most recent labs 1 year ago- she is fasting today  Negative Pap 2016- not SA in years, paps have always been negative,  She prefers to DC pap screening at this time, which is reasonable  She saw her ophthalmologist, Dr. 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  She would like a new prescription if possible She also uses OTC antacids prn  Patient Active Problem List   Diagnosis Date Noted  . Eustachian tube dysfunction 10/10/2014  . Lymphadenopathy 10/10/2014  . Trapezius muscle spasm 05/19/2013  . Screening for malignant neoplasm of cervix 02/27/2012  . Routine general medical examination at a health care facility 02/27/2012  . VISUAL  CHANGES 12/20/2010  . GERD 11/07/2010  . OTH SYMPTOMS INVOLVING SKIN&INTEG TISSUES 11/07/2010  . DYSPHAGIA 11/07/2010  . CONSTIPATION, CHRONIC 12/18/2009  . HAND SPRAIN 10/27/2007    Past Medical History:  Diagnosis Date  . Cataract    left eye  . Constipation   . GERD (gastroesophageal reflux disease)    zantac prn  . HSV (herpes simplex virus) infection   . Hyperlipidemia    tx red yeast rice - now normal per patient    Past Surgical History:  Procedure Laterality Date  . APPENDECTOMY    . CATARACT EXTRACTION Left 04/2016   [facility]  . EXPLORATORY LAPAROTOMY    . RETINAL DETACHMENT SURGERY    . VITRECTOMY Left   . VITRECTOMY AND CATARACT Left   . WISDOM TOOTH EXTRACTION      Social History   Tobacco Use  . Smoking status: Never Smoker  . Smokeless tobacco: Never Used  Substance Use Topics  . Alcohol use: Yes    Comment: occasionally beer/wine  . Drug use: No    Family History  Problem Relation Age of Onset  . Heart disease Mother        CHF  . Heart disease Sister        arrythmia  . Hypertension Sister   . Heart disease Sister        arrythmia  . Hypertension Sister   . Colon cancer Neg Hx   .  Esophageal cancer Neg Hx   . Rectal cancer Neg Hx   . Stomach cancer Neg Hx   . Breast cancer Neg Hx     Allergies  Allergen Reactions  . Cortisone Rash    Medication list has been reviewed and updated.  Current Outpatient Medications on File Prior to Visit  Medication Sig Dispense Refill  . Ascorbic Acid (VITAMIN C) 1000 MG tablet Take 1,000 mg by mouth daily.    . Calcium Carbonate Antacid (TUMS PO) Take 2 tablets by mouth at bedtime.    . Calcium-Magnesium-Vitamin D [id number] MG-MG-UNIT TB24 Take 1 tablet by mouth daily.    . Coenzyme Q10 (COQ10 PO) Take by mouth.    . Inositol Niacinate (NIACIN FLUSH FREE) 500 MG CAPS Take 1 capsule by mouth daily.    . Multiple Vitamin (MULTI-VITAMIN PO) Take 1 tablet by mouth daily.    . Red Yeast Rice 600 MG  CAPS Take 2 capsules by mouth daily.     . valACYclovir (VALTREX) 1000 MG tablet TAKE ONE TABLET BY MOUTH EVERY DAY (Patient taking differently: TAKE ONE-HALF TABLET BY MOUTH AS NEEDED) 30 tablet 2   No current facility-administered medications on file prior to visit.     Review of Systems:  As per HPI- otherwise negative. No fever or chills, no chest pain or shortness of breath No postmenopausal bleeding  Physical Examination: Vitals:   09/07/19 0829  BP: 128/80  Pulse: 68  Resp: 16  Temp: (!) 96.4 F (35.8 C)  SpO2: 98%   Vitals:   09/07/19 0829  Weight: 165 lb (74.8 kg)  Height: 5\' 6"  (1.676 m)   Body mass index is 26.63 kg/m. Ideal Body Weight: Weight in (lb) to have BMI = 25: 154.6  GEN: WDWN, NAD, Non-toxic, A & O x 3, mild overweight, looks well HEENT: Atraumatic, Normocephalic. Neck supple. No masses, No LAD.  PEERL, TM within normal limits bilaterally Ears and Nose: No external deformity. CV: RRR, No M/G/R. No JVD. No thrill. No extra heart sounds. PULM: CTA B, no wheezes, crackles, rhonchi. No retractions. No resp. distress. No accessory muscle use. ABD: S, NT, ND, +BS. No rebound. No HSM. EXTR: No c/c/e NEURO Normal gait.  PSYCH: Normally interactive. Conversant. Not depressed or anxious appearing.  Calm demeanor.  She has 2 small wounds on her left hand-no need for suture, but does need a tetanus booster  Assessment and Plan: Physical exam  Screening for diabetes mellitus - Plan: Comprehensive metabolic panel, Hemoglobin A1c  Screening for deficiency anemia - Plan: CBC  Screening for hyperlipidemia - Plan: Lipid panel  Screening for thyroid disorder - Plan: TSH  Gastroesophageal reflux disease, unspecified whether esophagitis present - Plan: omeprazole (PRILOSEC) 40 MG capsule  Open wound of left hand without foreign body, unspecified wound type, initial encounter - Plan: Td vaccine  Physical exam today-overall doing well Gave tetanus booster She  declines other immunizations today Blood pressure under good control Will plan further follow- up pending labs.    Signed Lamar Blinks, MD  Received her labs, message to patient  Results for orders placed or performed in visit on 09/07/19  CBC  Result Value Ref Range   WBC 8.9 4.0 - 10.5 K/uL   RBC 4.41 3.87 - 5.11 Mil/uL   Platelets 323.0 150.0 - 400.0 K/uL   Hemoglobin 14.1 12.0 - 15.0 g/dL   HCT 43.1 36.0 - 46.0 %   MCV 97.7 78.0 - 100.0 fl   MCHC 32.8 30.0 -  36.0 g/dL   RDW 13.1 11.5 - 15.5 %  Comprehensive metabolic panel  Result Value Ref Range   Sodium 138 135 - 145 mEq/L   Potassium 4.7 3.5 - 5.1 mEq/L   Chloride 101 96 - 112 mEq/L   CO2 30 19 - 32 mEq/L   Glucose, Bld 83 70 - 99 mg/dL   BUN 15 6 - 23 mg/dL   Creatinine, Ser 0.85 0.40 - 1.20 mg/dL   Total Bilirubin 0.5 0.2 - 1.2 mg/dL   Alkaline Phosphatase 70 39 - 117 U/L   AST 20 0 - 37 U/L   ALT 18 0 - 35 U/L   Total Protein 7.2 6.0 - 8.3 g/dL   Albumin 4.5 3.5 - 5.2 g/dL   GFR 66.45 >60.00 mL/min   Calcium 9.6 8.4 - 10.5 mg/dL  Hemoglobin A1c  Result Value Ref Range   Hgb A1c MFr Bld 5.5 4.6 - 6.5 %  Lipid panel  Result Value Ref Range   Cholesterol 237 (H) 0 - 200 mg/dL   Triglycerides 141.0 0.0 - 149.0 mg/dL   HDL 63.90 >39.00 mg/dL   VLDL 28.2 0.0 - 40.0 mg/dL   LDL Cholesterol 144 (H) 0 - 99 mg/dL   Total CHOL/HDL Ratio 4    NonHDL 172.63   TSH  Result Value Ref Range   TSH 4.53 (H) 0.35 - 4.50 uIU/mL    Blood counts are normal Metabolic profile normal 123456 normal, no sign of diabetes Your cholesterol is a little bit higher than ideal.  I calculated your estimated 10-year risk of cardiovascular disease with these numbers, see below:   The 10-year ASCVD risk score Mikey Bussing DC Jr., et al., 2013) is: 7.8%   Values used to calculate the score:     Age: 78 years     Sex: Female     Is Non-Hispanic African American: No     Diabetic: No     Tobacco smoker: No     Systolic Blood Pressure: 0000000  mmHg     Is BP treated: No     HDL Cholesterol: 63.9 mg/dL     Total Cholesterol: 237 mg/dL  We might consider cholesterol medication to help lower this risk.  Please let me know if this is something you would like to try  Finally, your TSH is slightly elevated.  This may indicate that you are hypothyroid.  However, with your TSH being this close to normal I would like to recheck prior to starting medication  I will order a repeat thyroid level for you, please schedule a lab visit at your convenience in about 1 month and we will follow-up  Take care, otherwise we can plan for a physical in 1 year

## 2019-09-05 ENCOUNTER — Other Ambulatory Visit: Payer: Self-pay

## 2019-09-05 ENCOUNTER — Ambulatory Visit
Admission: RE | Admit: 2019-09-05 | Discharge: 2019-09-05 | Disposition: A | Discharge status: Home / Self Care | Payer: PPO | Source: Ambulatory Visit | Attending: Family Medicine | Admitting: Family Medicine

## 2019-09-05 DIAGNOSIS — Z1231 Encounter for screening mammogram for malignant neoplasm of breast: Secondary | ICD-10-CM | POA: Diagnosis not present

## 2019-09-07 ENCOUNTER — Encounter: Payer: Self-pay | Admitting: Family Medicine

## 2019-09-07 ENCOUNTER — Ambulatory Visit (INDEPENDENT_AMBULATORY_CARE_PROVIDER_SITE_OTHER): Payer: PPO | Admitting: Family Medicine

## 2019-09-07 ENCOUNTER — Other Ambulatory Visit: Payer: Self-pay

## 2019-09-07 VITALS — BP 128/80 | HR 68 | Temp 96.4°F | Resp 16 | Ht 66.0 in | Wt 165.0 lb

## 2019-09-07 DIAGNOSIS — Z130 Encounter for screening for diseases of the blood and blood-forming organs and certain disorders involving the immune mechanism: Secondary | ICD-10-CM | POA: Diagnosis not present

## 2019-09-07 DIAGNOSIS — Z131 Encounter for screening for diabetes mellitus: Secondary | ICD-10-CM | POA: Diagnosis not present

## 2019-09-07 DIAGNOSIS — K219 Gastro-esophageal reflux disease without esophagitis: Secondary | ICD-10-CM | POA: Diagnosis not present

## 2019-09-07 DIAGNOSIS — Z0000 Encounter for general adult medical examination without abnormal findings: Secondary | ICD-10-CM | POA: Diagnosis not present

## 2019-09-07 DIAGNOSIS — Z23 Encounter for immunization: Secondary | ICD-10-CM | POA: Diagnosis not present

## 2019-09-07 DIAGNOSIS — R7989 Other specified abnormal findings of blood chemistry: Secondary | ICD-10-CM | POA: Diagnosis not present

## 2019-09-07 DIAGNOSIS — Z13220 Encounter for screening for lipoid disorders: Secondary | ICD-10-CM

## 2019-09-07 DIAGNOSIS — Z1329 Encounter for screening for other suspected endocrine disorder: Secondary | ICD-10-CM

## 2019-09-07 DIAGNOSIS — S61402A Unspecified open wound of left hand, initial encounter: Secondary | ICD-10-CM | POA: Diagnosis not present

## 2019-09-07 LAB — COMPREHENSIVE METABOLIC PANEL
ALT: 18 U/L (ref 0–35)
AST: 20 U/L (ref 0–37)
Albumin: 4.5 g/dL (ref 3.5–5.2)
Alkaline Phosphatase: 70 U/L (ref 39–117)
BUN: 15 mg/dL (ref 6–23)
CO2: 30 mEq/L (ref 19–32)
Calcium: 9.6 mg/dL (ref 8.4–10.5)
Chloride: 101 mEq/L (ref 96–112)
Creatinine, Ser: 0.85 mg/dL (ref 0.40–1.20)
GFR: 66.45 mL/min (ref >60.00)
Glucose, Bld: 83 mg/dL (ref 70–99)
Potassium: 4.7 mEq/L (ref 3.5–5.1)
Sodium: 138 mEq/L (ref 135–145)
Total Bilirubin: 0.5 mg/dL (ref 0.2–1.2)
Total Protein: 7.2 g/dL (ref 6.0–8.3)

## 2019-09-07 LAB — LIPID PANEL
Cholesterol: 237 mg/dL — ABNORMAL HIGH (ref 0–200)
HDL: 63.9 mg/dL (ref >39.00)
LDL Cholesterol: 144 mg/dL — ABNORMAL HIGH (ref 0–99)
NonHDL: 172.63
Total CHOL/HDL Ratio: 4
Triglycerides: 141 mg/dL (ref 0.0–149.0)
VLDL: 28.2 mg/dL (ref 0.0–40.0)

## 2019-09-07 LAB — TSH: TSH: 4.53 u[IU]/mL — ABNORMAL HIGH (ref 0.35–4.50)

## 2019-09-07 LAB — CBC
HCT: 43.1 % (ref 36.0–46.0)
Hemoglobin: 14.1 g/dL (ref 12.0–15.0)
MCHC: 32.8 g/dL (ref 30.0–36.0)
MCV: 97.7 fl (ref 78.0–100.0)
Platelets: 323 10*3/uL (ref 150.0–400.0)
RBC: 4.41 Mil/uL (ref 3.87–5.11)
RDW: 13.1 % (ref 11.5–15.5)
WBC: 8.9 10*3/uL (ref 4.0–10.5)

## 2019-09-07 LAB — HEMOGLOBIN A1C: Hgb A1c MFr Bld: 5.5 % (ref 4.6–6.5)

## 2019-09-07 MED ORDER — OMEPRAZOLE 40 MG PO CPDR: 40.0000 mg | DELAYED_RELEASE_CAPSULE | Freq: Every day | ORAL | 3 refills | Status: DC

## 2019-09-07 NOTE — Addendum Note (Signed)
Addended by: Lamar Blinks C on: 09/07/2019 03:21 PM   Modules accepted: Orders

## 2019-09-13 DIAGNOSIS — H40023 Open angle with borderline findings, high risk, bilateral: Secondary | ICD-10-CM | POA: Diagnosis not present

## 2019-09-19 ENCOUNTER — Encounter: Payer: Self-pay | Admitting: Family Medicine

## 2019-11-25 ENCOUNTER — Encounter: Payer: Self-pay | Admitting: Family Medicine

## 2019-11-25 ENCOUNTER — Other Ambulatory Visit: Payer: Self-pay

## 2019-11-25 ENCOUNTER — Other Ambulatory Visit (INDEPENDENT_AMBULATORY_CARE_PROVIDER_SITE_OTHER): Payer: PPO

## 2019-11-25 DIAGNOSIS — R7989 Other specified abnormal findings of blood chemistry: Secondary | ICD-10-CM | POA: Diagnosis not present

## 2019-11-25 LAB — TSH: TSH: 4.48 u[IU]/mL (ref 0.35–4.50)

## 2019-11-25 LAB — T3, FREE: T3, Free: 3 pg/mL (ref 2.3–4.2)

## 2020-02-29 DIAGNOSIS — H25011 Cortical age-related cataract, right eye: Secondary | ICD-10-CM | POA: Diagnosis not present

## 2020-02-29 DIAGNOSIS — H2511 Age-related nuclear cataract, right eye: Secondary | ICD-10-CM | POA: Diagnosis not present

## 2020-02-29 DIAGNOSIS — H40031 Anatomical narrow angle, right eye: Secondary | ICD-10-CM | POA: Diagnosis not present

## 2020-02-29 DIAGNOSIS — H25041 Posterior subcapsular polar age-related cataract, right eye: Secondary | ICD-10-CM | POA: Diagnosis not present

## 2020-02-29 DIAGNOSIS — H35033 Hypertensive retinopathy, bilateral: Secondary | ICD-10-CM | POA: Diagnosis not present

## 2020-02-29 DIAGNOSIS — H40023 Open angle with borderline findings, high risk, bilateral: Secondary | ICD-10-CM | POA: Diagnosis not present

## 2020-03-27 ENCOUNTER — Encounter: Payer: Self-pay | Admitting: Family Medicine

## 2020-04-03 DIAGNOSIS — H25011 Cortical age-related cataract, right eye: Secondary | ICD-10-CM | POA: Diagnosis not present

## 2020-04-03 DIAGNOSIS — H25041 Posterior subcapsular polar age-related cataract, right eye: Secondary | ICD-10-CM | POA: Diagnosis not present

## 2020-04-03 DIAGNOSIS — H25811 Combined forms of age-related cataract, right eye: Secondary | ICD-10-CM | POA: Diagnosis not present

## 2020-04-03 DIAGNOSIS — H2511 Age-related nuclear cataract, right eye: Secondary | ICD-10-CM | POA: Diagnosis not present

## 2020-07-23 ENCOUNTER — Other Ambulatory Visit: Payer: Self-pay | Admitting: Family Medicine

## 2020-07-23 DIAGNOSIS — Z1231 Encounter for screening mammogram for malignant neoplasm of breast: Secondary | ICD-10-CM

## 2020-09-05 ENCOUNTER — Ambulatory Visit
Admission: RE | Admit: 2020-09-05 | Discharge: 2020-09-05 | Disposition: A | Discharge status: Home / Self Care | Payer: PPO | Source: Ambulatory Visit | Attending: Family Medicine | Admitting: Family Medicine

## 2020-09-05 ENCOUNTER — Other Ambulatory Visit: Payer: Self-pay

## 2020-09-05 ENCOUNTER — Ambulatory Visit: Payer: PPO

## 2020-09-05 DIAGNOSIS — Z1231 Encounter for screening mammogram for malignant neoplasm of breast: Secondary | ICD-10-CM | POA: Diagnosis not present

## 2020-09-14 NOTE — Progress Notes (Addendum)
[facility] [place], [place], [place] [address] [phone]  Date:  09/19/2020   Name:  Danielle Daniel   DOB:  Feb 23, 1951   MRN:  [id number]  PCP:  Darreld Mclean, MD    Chief Complaint: Annual Exam   History of Present Illness:  Danielle Daniel is a 69 y.o. very pleasant female patient who presents with the following:  Patient is here today for physical exam- history of GERD, dyslipidemia treated with red yeast rice and niacin  Last seen by myself about 1 year ago  At her last visit, she was working part-time teaching at [employer] She enjoys exercising on her recumbent bike  COVID-19 vaccine- not done  Flu vaccine- refuses  Bone density scan- will do for pt, will order  Mammogram up-to-date Last normal Pap 2016- pt declines today  Pneumonia vaccine- refuses  Shingles vaccine- refuses  Colonoscopy 2017- will be due next year, she will discuss with her GI doc  Most recent labs 1 year ago-at that time her TSH was slightly elevated.  Upon recheck return to normal   She is getting exercise by working in her yard No CP or SOB with exercise No PMB Never a smoker  Patient Active Problem List   Diagnosis Date Noted  . Eustachian tube dysfunction 10/10/2014  . Lymphadenopathy 10/10/2014  . Trapezius muscle spasm 05/19/2013  . VISUAL CHANGES 12/20/2010  . GERD 11/07/2010  . DYSPHAGIA 11/07/2010  . CONSTIPATION, CHRONIC 12/18/2009  . HAND SPRAIN 10/27/2007    Past Medical History:  Diagnosis Date  . Cataract    left eye  . Constipation   . GERD (gastroesophageal reflux disease)    zantac prn  . HSV (herpes simplex virus) infection   . Hyperlipidemia    tx red yeast rice - now normal per patient    Past Surgical History:  Procedure Laterality Date  . APPENDECTOMY    . CATARACT EXTRACTION Left 04/2016   [facility]  . EXPLORATORY LAPAROTOMY    . RETINAL DETACHMENT SURGERY    .  VITRECTOMY Left   . VITRECTOMY AND CATARACT Left   . WISDOM TOOTH EXTRACTION      Social History   Tobacco Use  . Smoking status: Never Smoker  . Smokeless tobacco: Never Used  Substance Use Topics  . Alcohol use: Yes    Comment: occasionally beer/wine  . Drug use: No    Family History  Problem Relation Age of Onset  . Heart disease Mother        CHF  . Heart disease Sister        arrythmia  . Hypertension Sister   . Heart disease Sister        arrythmia  . Hypertension Sister   . Colon cancer Neg Hx   . Esophageal cancer Neg Hx   . Rectal cancer Neg Hx   . Stomach cancer Neg Hx   . Breast cancer Neg Hx     Allergies  Allergen Reactions  . Cortisone Rash    Medication list has been reviewed and updated.  Current Outpatient Medications on File Prior to Visit  Medication Sig Dispense Refill  . Ascorbic Acid (VITAMIN C) 1000 MG tablet Take 1,000 mg by mouth daily.    . Calcium Citrate 333 MG TABS Take by mouth.    . Calcium-Magnesium-Vitamin D [id number] MG-MG-UNIT TB24 Take 1 tablet by mouth daily.    Marland Kitchen  Coenzyme Q10 (COQ10 PO) Take 100 mg by mouth.     . Inositol Niacinate (NIACIN FLUSH FREE) 500 MG CAPS Take 1 capsule by mouth daily.    . Magnesium-Zinc 133.33-5 MG TABS Take by mouth.    . Multiple Vitamins-Minerals (WOMENS 50+ MULTI VITAMIN/MIN PO) Take by mouth.    . Omega-3 Fatty Acids (FISH OIL) 1200 MG CAPS Take by mouth.    . QUERCETIN PO Take 500 mg by mouth daily.    . Red Yeast Rice 600 MG CAPS Take 2 capsules by mouth daily.     . Selenium 200 MCG CAPS Take by mouth.     No current facility-administered medications on file prior to visit.    Review of Systems:  As per HPI- otherwise negative.   Physical Examination: Vitals:   09/19/20 0819  BP: 122/80  Pulse: 68  SpO2: 98%   There were no vitals filed for this visit. There is no height or weight on file to calculate BMI. Ideal Body Weight:    GEN: no acute distress.  Overweight,  otherwise appears well  HEENT: Atraumatic, Normocephalic.  PEERL, normal neck exam  Ears and Nose: No external deformity. CV: RRR, No M/G/R. No JVD. No thrill. No extra heart sounds. PULM: CTA B, no wheezes, crackles, rhonchi. No retractions. No resp. distress. No accessory muscle use. ABD: S, NT, ND. No rebound. No HSM. EXTR: No c/c/e PSYCH: Normally interactive. Conversant.    Assessment and Plan: Physical exam  Screening for diabetes mellitus - Plan: Comprehensive metabolic panel, Hemoglobin A1c  Screening for deficiency anemia - Plan: CBC  Screening for thyroid disorder - Plan: TSH  Screening for hyperlipidemia - Plan: Lipid panel  Elevated TSH - Plan: TSH  Gastroesophageal reflux disease, unspecified whether esophagitis present - Plan: omeprazole (PRILOSEC) 20 MG capsule  Fatigue, unspecified type - Plan: TSH, VITAMIN D 25 Hydroxy (Vit-D Deficiency, Fractures)  Estrogen deficiency - Plan: DG Bone Density  Pt here today for a CPE Ordered bone density Routine labs pending She would like to try 20 mg of omeprazole, wonders if this will do the job of 40 mg sufficiently Encouraged recommended immunizations, health diet and exercise program Will plan further follow- up pending labs.  This visit occurred during the SARS-CoV-2 public health emergency.  Safety protocols were in place, including screening questions prior to the visit, additional usage of staff PPE, and extensive cleaning of exam room while observing appropriate contact time as indicated for disinfecting solutions.    Signed Lamar Blinks, MD  Received her labs as below, message to patient  Results for orders placed or performed in visit on 09/19/20  CBC  Result Value Ref Range   WBC 6.8 4.0 - 10.5 K/uL   RBC 4.39 3.87 - 5.11 Mil/uL   Platelets 316.0 150 - 400 K/uL   Hemoglobin 14.1 12.0 - 15.0 g/dL   HCT 42.5 36 - 46 %   MCV 96.8 78.0 - 100.0 fl   MCHC 33.1 30.0 - 36.0 g/dL   RDW 12.9 11.5 - 15.5 %   Comprehensive metabolic panel  Result Value Ref Range   Sodium 139 135 - 145 mEq/L   Potassium 5.0 3.5 - 5.1 mEq/L   Chloride 102 96 - 112 mEq/L   CO2 31 19 - 32 mEq/L   Glucose, Bld 78 70 - 99 mg/dL   BUN 15 6 - 23 mg/dL   Creatinine, Ser 0.98 0.40 - 1.20 mg/dL   Total Bilirubin 0.5 0.2 - 1.2  mg/dL   Alkaline Phosphatase 65 39 - 117 U/L   AST 23 0 - 37 U/L   ALT 20 0 - 35 U/L   Total Protein 7.2 6.0 - 8.3 g/dL   Albumin 4.4 3.5 - 5.2 g/dL   GFR 58.96 (L) >60.00 mL/min   Calcium 9.4 8.4 - 10.5 mg/dL  Hemoglobin A1c  Result Value Ref Range   Hgb A1c MFr Bld 5.5 4.6 - 6.5 %  Lipid panel  Result Value Ref Range   Cholesterol 221 (H) 0 - 200 mg/dL   Triglycerides 97.0 0 - 149 mg/dL   HDL 65.20 >39.00 mg/dL   VLDL 19.4 0.0 - 40.0 mg/dL   LDL Cholesterol 137 (H) 0 - 99 mg/dL   Total CHOL/HDL Ratio 3    NonHDL 155.92   TSH  Result Value Ref Range   TSH 4.44 0.35 - 4.50 uIU/mL  VITAMIN D 25 Hydroxy (Vit-D Deficiency, Fractures)  Result Value Ref Range   VITD 54.69 30.00 - 100.00 ng/mL

## 2020-09-14 NOTE — Patient Instructions (Signed)
It was good to see you again today, I will be in touch with your results as soon as possible Please go to the lab, and then to the ground floor imaging dept for your bone density  We can try 20 mg of omeprazole- if this is not sufficient to control your symptoms we can go back to 40 mg  Please let me know I do encourage you to get your flu vaccine and especially your covid 19 series; vaccination is your best defense against severe pneumonia and possible death from covid 01-08-2023 I also recommend your routine pneumonia vaccine series and shingles vaccine (shingrix), but covid is first priority at this time.     Health Maintenance After Age 2 After age 71, you are at a higher risk for certain long-term diseases and infections as well as injuries from falls. Falls are a major cause of broken bones and head injuries in people who are older than age 70. Getting regular preventive care can help to keep you healthy and well. Preventive care includes getting regular testing and making lifestyle changes as recommended by your health care provider. Talk with your health care provider about:  Which screenings and tests you should have. A screening is a test that checks for a disease when you have no symptoms.  A diet and exercise plan that is right for you. What should I know about screenings and tests to prevent falls? Screening and testing are the best ways to find a health problem early. Early diagnosis and treatment give you the best chance of managing medical conditions that are common after age 74. Certain conditions and lifestyle choices may make you more likely to have a fall. Your health care provider may recommend:  Regular vision checks. Poor vision and conditions such as cataracts can make you more likely to have a fall. If you wear glasses, make sure to get your prescription updated if your vision changes.  Medicine review. Work with your health care provider to regularly review all of the medicines  you are taking, including over-the-counter medicines. Ask your health care provider about any side effects that may make you more likely to have a fall. Tell your health care provider if any medicines that you take make you feel dizzy or sleepy.  Osteoporosis screening. Osteoporosis is a condition that causes the bones to get weaker. This can make the bones weak and cause them to break more easily.  Blood pressure screening. Blood pressure changes and medicines to control blood pressure can make you feel dizzy.  Strength and balance checks. Your health care provider may recommend certain tests to check your strength and balance while standing, walking, or changing positions.  Foot health exam. Foot pain and numbness, as well as not wearing proper footwear, can make you more likely to have a fall.  Depression screening. You may be more likely to have a fall if you have a fear of falling, feel emotionally low, or feel unable to do activities that you used to do.  Alcohol use screening. Using too much alcohol can affect your balance and may make you more likely to have a fall. What actions can I take to lower my risk of falls? General instructions  Talk with your health care provider about your risks for falling. Tell your health care provider if: ? You fall. Be sure to tell your health care provider about all falls, even ones that seem minor. ? You feel dizzy, sleepy, or off-balance.  Take over-the-counter  and prescription medicines only as told by your health care provider. These include any supplements.  Eat a healthy diet and maintain a healthy weight. A healthy diet includes low-fat dairy products, low-fat (lean) meats, and fiber from whole grains, beans, and lots of fruits and vegetables. Home safety  Remove any tripping hazards, such as rugs, cords, and clutter.  Install safety equipment such as grab bars in bathrooms and safety rails on stairs.  Keep rooms and walkways  well-lit. Activity   Follow a regular exercise program to stay fit. This will help you maintain your balance. Ask your health care provider what types of exercise are appropriate for you.  If you need a cane or walker, use it as recommended by your health care provider.  Wear supportive shoes that have nonskid soles. Lifestyle  Do not drink alcohol if your health care provider tells you not to drink.  If you drink alcohol, limit how much you have: ? 0-1 drink a day for women. ? 0-2 drinks a day for men.  Be aware of how much alcohol is in your drink. In the [place] one drink equals one typical bottle of beer (12 oz), one-half glass of wine (5 oz), or one shot of hard liquor (1 oz).  Do not use any products that contain nicotine or tobacco, such as cigarettes and e-cigarettes. If you need help quitting, ask your health care provider. Summary  Having a healthy lifestyle and getting preventive care can help to protect your health and wellness after age 58.  Screening and testing are the best way to find a health problem early and help you avoid having a fall. Early diagnosis and treatment give you the best chance for managing medical conditions that are more common for people who are older than age 44.  Falls are a major cause of broken bones and head injuries in people who are older than age 102. Take precautions to prevent a fall at home.  Work with your health care provider to learn what changes you can make to improve your health and wellness and to prevent falls. This information is not intended to replace advice given to you by your health care provider. Make sure you discuss any questions you have with your health care provider. Document Revised: 01/27/2019 Document Reviewed: 08/19/2017 Elsevier Patient Education  2020 [employer].

## 2020-09-19 ENCOUNTER — Other Ambulatory Visit: Payer: Self-pay

## 2020-09-19 ENCOUNTER — Ambulatory Visit (INDEPENDENT_AMBULATORY_CARE_PROVIDER_SITE_OTHER): Payer: PPO | Admitting: Family Medicine

## 2020-09-19 ENCOUNTER — Encounter: Payer: Self-pay | Admitting: Family Medicine

## 2020-09-19 VITALS — BP 122/80 | HR 68 | Wt 162.0 lb

## 2020-09-19 DIAGNOSIS — Z131 Encounter for screening for diabetes mellitus: Secondary | ICD-10-CM | POA: Diagnosis not present

## 2020-09-19 DIAGNOSIS — Z13220 Encounter for screening for lipoid disorders: Secondary | ICD-10-CM | POA: Diagnosis not present

## 2020-09-19 DIAGNOSIS — Z130 Encounter for screening for diseases of the blood and blood-forming organs and certain disorders involving the immune mechanism: Secondary | ICD-10-CM

## 2020-09-19 DIAGNOSIS — Z0000 Encounter for general adult medical examination without abnormal findings: Secondary | ICD-10-CM

## 2020-09-19 DIAGNOSIS — R5383 Other fatigue: Secondary | ICD-10-CM | POA: Diagnosis not present

## 2020-09-19 DIAGNOSIS — Z1329 Encounter for screening for other suspected endocrine disorder: Secondary | ICD-10-CM

## 2020-09-19 DIAGNOSIS — E2839 Other primary ovarian failure: Secondary | ICD-10-CM | POA: Diagnosis not present

## 2020-09-19 DIAGNOSIS — K219 Gastro-esophageal reflux disease without esophagitis: Secondary | ICD-10-CM

## 2020-09-19 DIAGNOSIS — R7989 Other specified abnormal findings of blood chemistry: Secondary | ICD-10-CM | POA: Diagnosis not present

## 2020-09-19 LAB — CBC
HCT: 42.5 % (ref 36.0–46.0)
Hemoglobin: 14.1 g/dL (ref 12.0–15.0)
MCHC: 33.1 g/dL (ref 30.0–36.0)
MCV: 96.8 fl (ref 78.0–100.0)
Platelets: 316 10*3/uL (ref 150.0–400.0)
RBC: 4.39 Mil/uL (ref 3.87–5.11)
RDW: 12.9 % (ref 11.5–15.5)
WBC: 6.8 10*3/uL (ref 4.0–10.5)

## 2020-09-19 LAB — COMPREHENSIVE METABOLIC PANEL
ALT: 20 U/L (ref 0–35)
AST: 23 U/L (ref 0–37)
Albumin: 4.4 g/dL (ref 3.5–5.2)
Alkaline Phosphatase: 65 U/L (ref 39–117)
BUN: 15 mg/dL (ref 6–23)
CO2: 31 mEq/L (ref 19–32)
Calcium: 9.4 mg/dL (ref 8.4–10.5)
Chloride: 102 mEq/L (ref 96–112)
Creatinine, Ser: 0.98 mg/dL (ref 0.40–1.20)
GFR: 58.96 mL/min — ABNORMAL LOW (ref >60.00)
Glucose, Bld: 78 mg/dL (ref 70–99)
Potassium: 5 mEq/L (ref 3.5–5.1)
Sodium: 139 mEq/L (ref 135–145)
Total Bilirubin: 0.5 mg/dL (ref 0.2–1.2)
Total Protein: 7.2 g/dL (ref 6.0–8.3)

## 2020-09-19 LAB — LIPID PANEL
Cholesterol: 221 mg/dL — ABNORMAL HIGH (ref 0–200)
HDL: 65.2 mg/dL (ref >39.00)
LDL Cholesterol: 137 mg/dL — ABNORMAL HIGH (ref 0–99)
NonHDL: 155.92
Total CHOL/HDL Ratio: 3
Triglycerides: 97 mg/dL (ref 0.0–149.0)
VLDL: 19.4 mg/dL (ref 0.0–40.0)

## 2020-09-19 LAB — TSH: TSH: 4.44 u[IU]/mL (ref 0.35–4.50)

## 2020-09-19 LAB — HEMOGLOBIN A1C: Hgb A1c MFr Bld: 5.5 % (ref 4.6–6.5)

## 2020-09-19 LAB — VITAMIN D 25 HYDROXY (VIT D DEFICIENCY, FRACTURES): VITD: 54.69 ng/mL (ref 30.00–100.00)

## 2020-09-19 MED ORDER — OMEPRAZOLE 20 MG PO CPDR: 20.0000 mg | DELAYED_RELEASE_CAPSULE | Freq: Every day | ORAL | 3 refills | Status: DC

## 2020-09-20 DIAGNOSIS — H40023 Open angle with borderline findings, high risk, bilateral: Secondary | ICD-10-CM | POA: Diagnosis not present

## 2020-09-21 ENCOUNTER — Other Ambulatory Visit: Payer: Self-pay

## 2020-09-21 ENCOUNTER — Ambulatory Visit (HOSPITAL_BASED_OUTPATIENT_CLINIC_OR_DEPARTMENT_OTHER)
Admission: RE | Admit: 2020-09-21 | Discharge: 2020-09-21 | Disposition: A | Discharge status: Home / Self Care | Payer: PPO | Source: Ambulatory Visit | Attending: Family Medicine | Admitting: Family Medicine

## 2020-09-21 DIAGNOSIS — E2839 Other primary ovarian failure: Secondary | ICD-10-CM | POA: Insufficient documentation

## 2020-09-21 DIAGNOSIS — Z8739 Personal history of other diseases of the musculoskeletal system and connective tissue: Secondary | ICD-10-CM | POA: Diagnosis not present

## 2020-09-21 DIAGNOSIS — Z780 Asymptomatic menopausal state: Secondary | ICD-10-CM | POA: Diagnosis not present

## 2020-09-22 ENCOUNTER — Encounter: Payer: Self-pay | Admitting: Family Medicine

## 2020-12-20 DIAGNOSIS — H40023 Open angle with borderline findings, high risk, bilateral: Secondary | ICD-10-CM | POA: Diagnosis not present

## 2020-12-20 DIAGNOSIS — H35373 Puckering of macula, bilateral: Secondary | ICD-10-CM | POA: Diagnosis not present

## 2020-12-20 DIAGNOSIS — H04123 Dry eye syndrome of bilateral lacrimal glands: Secondary | ICD-10-CM | POA: Diagnosis not present

## 2020-12-20 DIAGNOSIS — H26491 Other secondary cataract, right eye: Secondary | ICD-10-CM | POA: Diagnosis not present

## 2020-12-25 IMAGING — MG DIGITAL SCREENING BILAT W/ TOMO W/ CAD
8 series · 8 of 24 positions shown · non-contrast
Comparison: Previous exam(s).

CLINICAL DATA: Screening.

EXAM:
DIGITAL SCREENING BILATERAL MAMMOGRAM WITH TOMO AND CAD

[L MLO synth-2D]
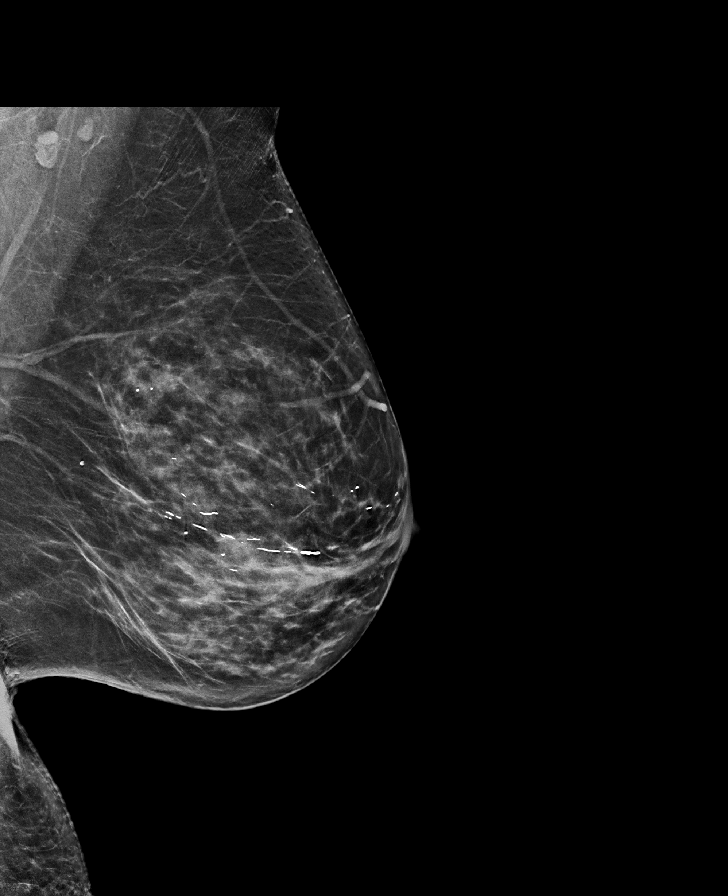

[L CC synth-2D]
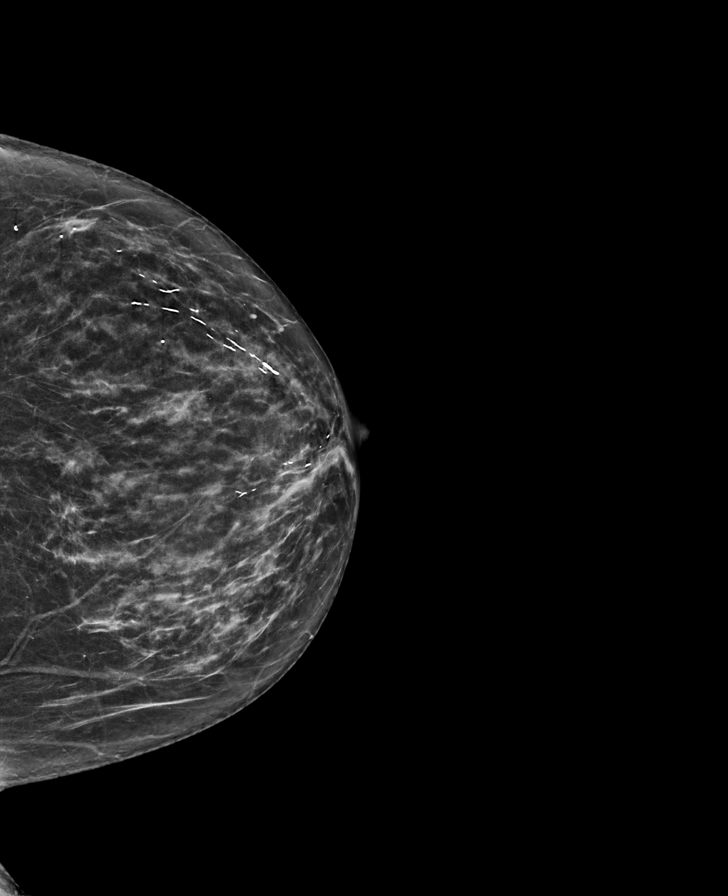

[R CC synth-2D]
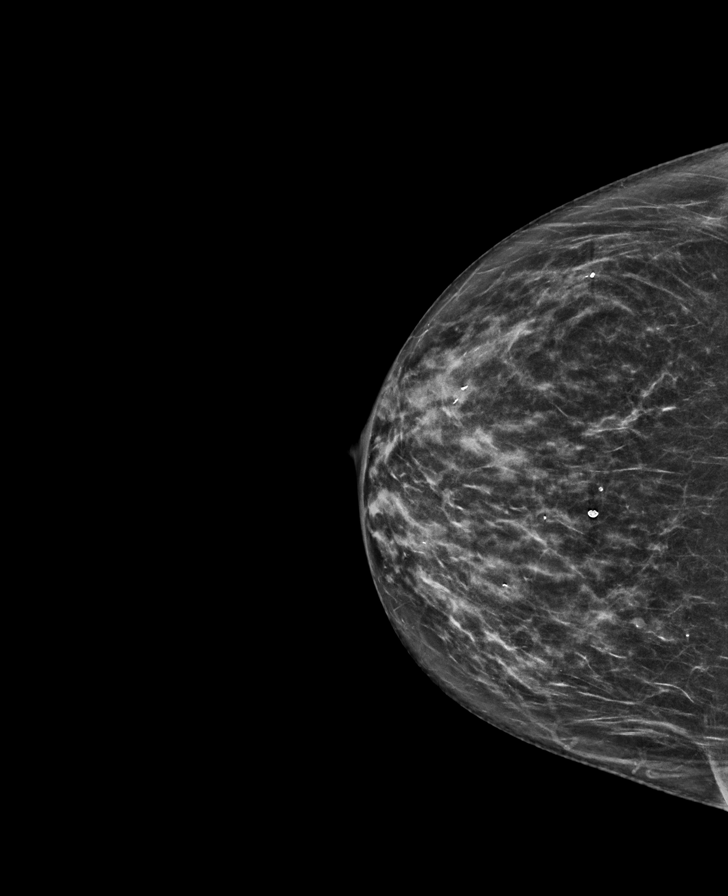

[R MLO synth-2D]
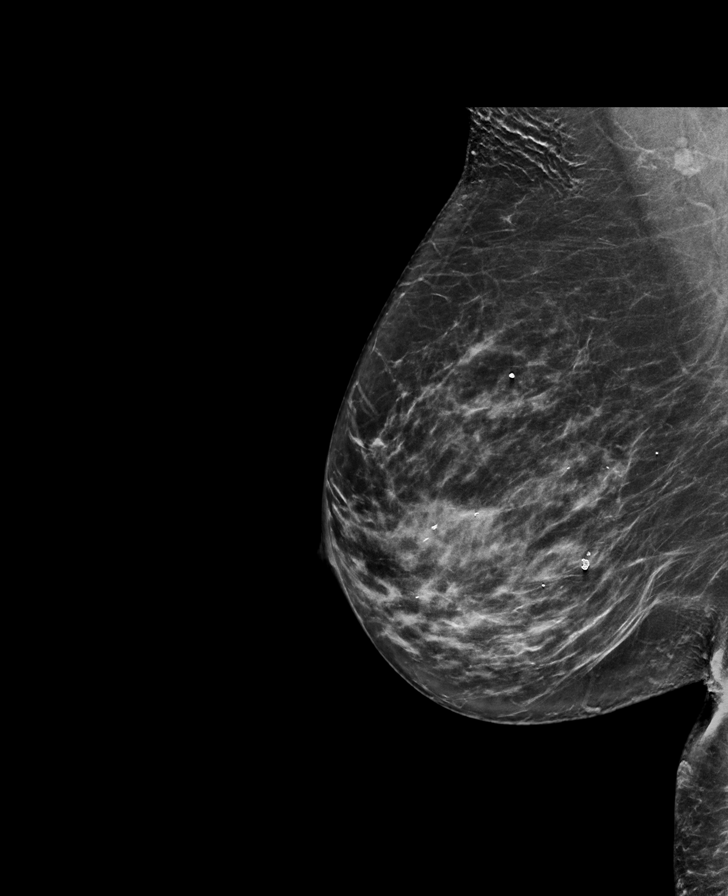

[L CC tomo · tomo slice 32/63.0]
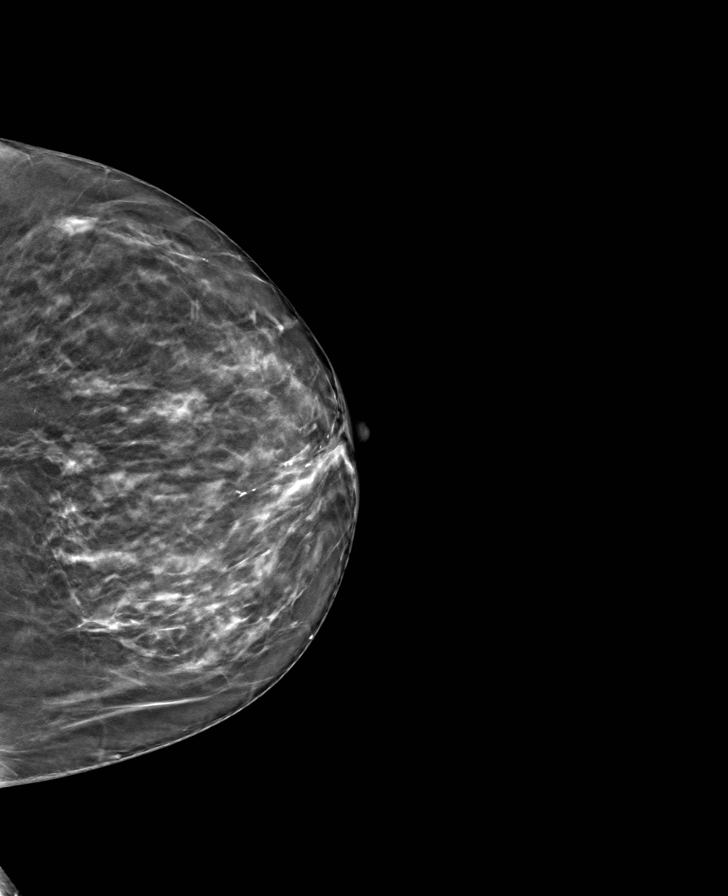

[R CC tomo · tomo slice 33/64.0]
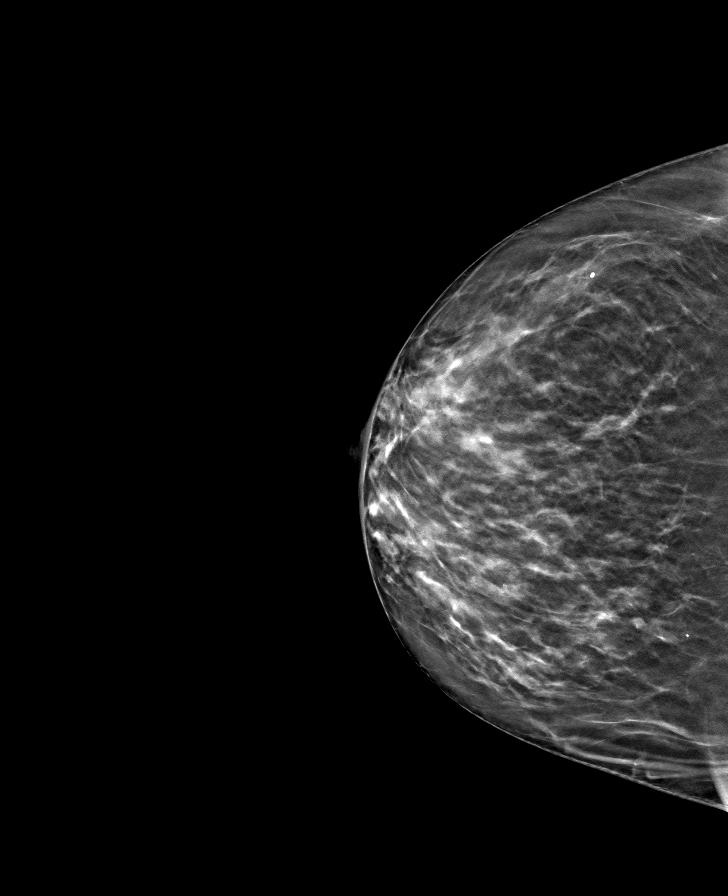

[R MLO tomo · tomo slice 38/75.0]
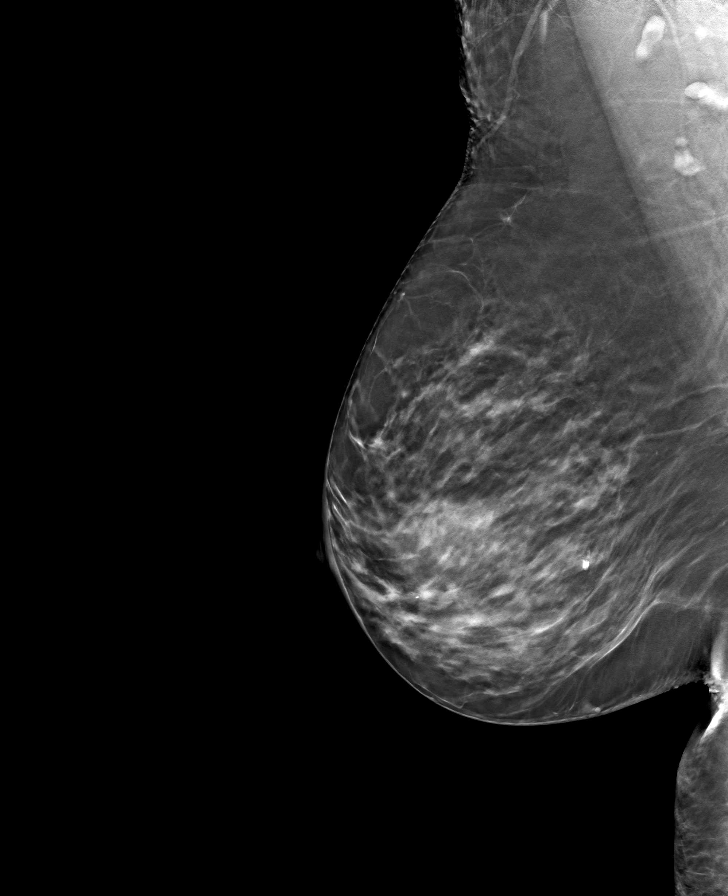

[L MLO tomo · tomo slice 37/74.0]
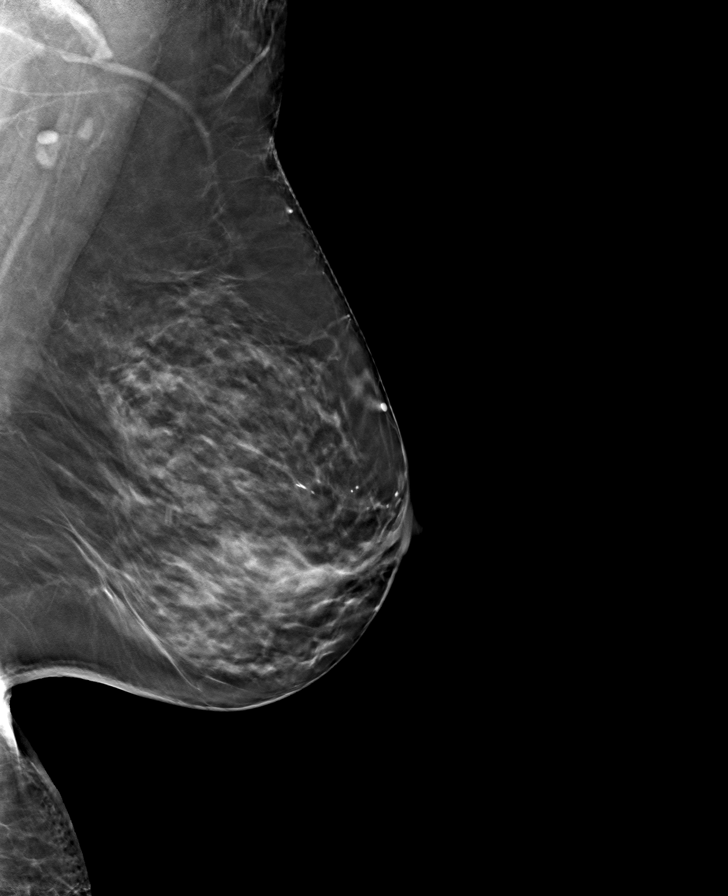

[8 of 24 positions shown; findings below may reference images not displayed]

ACR Breast Density Category c: The breast tissue is heterogeneously
dense, which may obscure small masses.
FINDINGS: There are no findings suspicious for malignancy. Images were
processed with CAD.
IMPRESSION: No mammographic evidence of malignancy. A result letter of this
screening mammogram will be mailed directly to the patient.

RECOMMENDATION:
Screening mammogram in one year. (Code:[id number])

BI-RADS CATEGORY  1: Negative.

## 2021-03-11 ENCOUNTER — Other Ambulatory Visit: Payer: Self-pay

## 2021-03-11 DIAGNOSIS — K219 Gastro-esophageal reflux disease without esophagitis: Secondary | ICD-10-CM

## 2021-03-11 MED ORDER — OMEPRAZOLE 20 MG PO CPDR: 20.0000 mg | DELAYED_RELEASE_CAPSULE | Freq: Every day | ORAL | 1 refills | Status: DC

## 2021-03-31 ENCOUNTER — Encounter: Payer: Self-pay | Admitting: Family Medicine

## 2021-04-01 MED ORDER — ACYCLOVIR 800 MG PO TABS: ORAL_TABLET | ORAL | 1 refills | Status: AC

## 2021-05-07 NOTE — Progress Notes (Signed)
[place] [facility] [address], [place], [place] [address] [phone] [id number]  Date:  05/08/2021   Name:  Danielle Daniel   DOB:  [birth year]   MRN:  [id number]  PCP:  Darreld Mclean, MD    Chief Complaint: right foot pain (And her veins seems to look bolder across the front of the foot. She does have some pins and needles in the upper thigh. )   History of Present Illness:  Danielle Daniel is a 70 y.o. very pleasant female patient who presents with the following:  Patient seen today with a concern about her foot- history of GERD, dyslipidemia treated with red yeast rice and niacin Last seen by myself for physical in December  Pt notes a concern with her right leg/foot She has noted some numbness and tingling in her right leg for nearly a year- this seems to be positional and will go away if she changes her position   Over the last couple of weeks she has felt like the veins on the dorsum of her right foot "stick up" more than on the left  She also will get a pain in her foot - over the lateral aspect- at times  No injury that she is aware of  She is otherwise feeling fine   She notes that omeprazole is working well for her GERD   No history of any blood clot No travel No SOB   Patient Active Problem List   Diagnosis Date Noted   Eustachian tube dysfunction 10/10/2014   Lymphadenopathy 10/10/2014   Trapezius muscle spasm 05/19/2013   VISUAL CHANGES 12/20/2010   GERD 11/07/2010   DYSPHAGIA 11/07/2010   CONSTIPATION, CHRONIC 12/18/2009   HAND SPRAIN 10/27/2007    Past Medical History:  Diagnosis Date   Cataract    left eye   Constipation    GERD (gastroesophageal reflux disease)    zantac prn   HSV (herpes simplex virus) infection    Hyperlipidemia    tx red yeast rice - now normal per patient    Past Surgical History:  Procedure Laterality Date   APPENDECTOMY     CATARACT EXTRACTION Left 04/2016    [facility]   EXPLORATORY LAPAROTOMY     RETINAL DETACHMENT SURGERY     VITRECTOMY Left    VITRECTOMY AND CATARACT Left    WISDOM TOOTH EXTRACTION      Social History   Tobacco Use   Smoking status: Never   Smokeless tobacco: Never  Substance Use Topics   Alcohol use: Yes    Comment: occasionally beer/wine   Drug use: No    Family History  Problem Relation Age of Onset   Heart disease Mother        CHF   Heart disease Sister        arrythmia   Hypertension Sister    Heart disease Sister        arrythmia   Hypertension Sister    Colon cancer Neg Hx    Esophageal cancer Neg Hx    Rectal cancer Neg Hx    Stomach cancer Neg Hx    Breast cancer Neg Hx     Allergies  Allergen Reactions   Cortisone Rash    Medication list has been reviewed and updated.  Current Outpatient Medications on File Prior to Visit  Medication Sig Dispense Refill   acyclovir (ZOVIRAX) 800 MG tablet Take 800 mg TID for 2  days as needed for HSV recurrence 30 tablet 1   Ascorbic Acid (VITAMIN C) 1000 MG tablet Take 1,000 mg by mouth daily.     Calcium-Magnesium-Vitamin D 600-40-500 MG-MG-UNIT TB24 Take 1 tablet by mouth daily.     Coenzyme Q10 (COQ10 PO) Take 100 mg by mouth.      Inositol Niacinate 500 MG CAPS Take 1 capsule by mouth daily.     Multiple Vitamins-Minerals (WOMENS 50+ MULTI VITAMIN/MIN PO) Take by mouth.     Omega-3 Fatty Acids (FISH OIL) 1200 MG CAPS Take by mouth.     omeprazole (PRILOSEC) 20 MG capsule Take 1 capsule (20 mg total) by mouth daily. 90 capsule 1   QUERCETIN PO Take 500 mg by mouth daily.     Red Yeast Rice 600 MG CAPS Take 2 capsules by mouth daily.      Selenium 200 MCG CAPS Take by mouth.     No current facility-administered medications on file prior to visit.    Review of Systems:  As per HPI- otherwise negative.   Physical Examination: Vitals:   05/08/21 1525  BP: 138/80  Pulse: 80  Resp: 18  Temp: 97.8 F (36.6 C)  SpO2: 97%   Vitals:    05/08/21 1525  Weight: 166 lb (75.3 kg)  Height: 5\' 6"  (1.676 m)   Body mass index is 26.79 kg/m. Ideal Body Weight: Weight in (lb) to have BMI = 25: 154.6  GEN: no acute distress.  Mild overweight, looks well HEENT: Atraumatic, Normocephalic.  Ears and Nose: No external deformity. CV: RRR, No M/G/R. No JVD. No thrill. No extra heart sounds. PULM: CTA B, no wheezes, crackles, rhonchi. No retractions. No resp. distress. No accessory muscle use. EXTR: No c/c/e PSYCH: Normally interactive. Conversant.  Strong pulses in both feet.  Calves are normal and non- tender Pt has perhaps more prominent veins on the dorsum of the right foot- appear likely normal  Right ankle, foot/ toe and knee joints with normal ROM, no pain   Assessment and Plan: Vein symptom - Plan: US Venous Img Lower Unilateral Right Pt with concern about prominent veins in her right foot Will rule out DVT as above Assuming no clot this is likely physiologic change of aging but we can look further if warranted  This visit occurred during the SARS-CoV-2 public health emergency.  Safety protocols were in place, including screening questions prior to the visit, additional usage of staff PPE, and extensive cleaning of exam room while observing appropriate contact time as indicated for disinfecting solutions.   Signed Lamar Blinks, MD

## 2021-05-08 ENCOUNTER — Ambulatory Visit (INDEPENDENT_AMBULATORY_CARE_PROVIDER_SITE_OTHER): Payer: Medicare HMO | Admitting: Family Medicine

## 2021-05-08 ENCOUNTER — Other Ambulatory Visit: Payer: Self-pay

## 2021-05-08 VITALS — BP 138/80 | HR 80 | Temp 97.8°F | Resp 18 | Ht 66.0 in | Wt 166.0 lb

## 2021-05-08 DIAGNOSIS — R0989 Other specified symptoms and signs involving the circulatory and respiratory systems: Secondary | ICD-10-CM | POA: Diagnosis not present

## 2021-05-08 NOTE — Patient Instructions (Signed)
It was good to see you today I think the prominent veins in your foot are likely due to benign changes over time.  However, we will certainly make sure there is no evidence of blood clot in your leg.  Please stop by the imaging department on the ground floor to schedule your ultrasound

## 2021-05-09 ENCOUNTER — Encounter: Payer: Self-pay | Admitting: Family Medicine

## 2021-05-09 ENCOUNTER — Ambulatory Visit (HOSPITAL_BASED_OUTPATIENT_CLINIC_OR_DEPARTMENT_OTHER)
Admission: RE | Admit: 2021-05-09 | Discharge: 2021-05-09 | Disposition: A | Discharge status: Home / Self Care | Payer: Medicare HMO | Source: Ambulatory Visit | Attending: Family Medicine | Admitting: Family Medicine

## 2021-05-09 DIAGNOSIS — M79671 Pain in right foot: Secondary | ICD-10-CM | POA: Diagnosis not present

## 2021-05-09 DIAGNOSIS — M7989 Other specified soft tissue disorders: Secondary | ICD-10-CM | POA: Diagnosis not present

## 2021-05-09 DIAGNOSIS — R600 Localized edema: Secondary | ICD-10-CM | POA: Diagnosis not present

## 2021-05-09 DIAGNOSIS — R0989 Other specified symptoms and signs involving the circulatory and respiratory systems: Secondary | ICD-10-CM | POA: Diagnosis not present

## 2021-05-17 ENCOUNTER — Telehealth: Payer: Self-pay | Admitting: Family Medicine

## 2021-05-17 NOTE — Telephone Encounter (Signed)
Copied from [place] [phone]. Topic: Medicare AWV >> May 17, 2021  1:47 PM Harris-Coley, Hannah Beat wrote: Reason for CRM: Left message for patient to schedule Annual Wellness Visit.  Please schedule with Health Nurse Advisor Augustine Radar. at [facility].

## 2021-05-27 ENCOUNTER — Ambulatory Visit (INDEPENDENT_AMBULATORY_CARE_PROVIDER_SITE_OTHER): Payer: Medicare HMO

## 2021-05-27 VITALS — Ht 66.0 in | Wt 166.0 lb

## 2021-05-27 DIAGNOSIS — Z0000 Encounter for general adult medical examination without abnormal findings: Secondary | ICD-10-CM | POA: Diagnosis not present

## 2021-05-27 NOTE — Patient Instructions (Signed)
[name] , Thank you for taking time to complete your Medicare Wellness Visit. I appreciate your ongoing commitment to your health goals. Please review the following plan we discussed and let me know if I can assist you in the future.   Screening recommendations/referrals: Colonoscopy: Completed 08/27/2016-Due-08/27/2021 Mammogram: Completed 09/05/2020-Due 09/05/2021 Bone Density: Completed 09/21/2020-Due 112/12/2021 Recommended yearly ophthalmology/optometry visit for glaucoma screening and checkup Recommended yearly dental visit for hygiene and checkup  Vaccinations: Influenza vaccine: Declined Pneumococcal vaccine: Declined Tdap vaccine: Up to date-Due-09/06/2029 Shingles vaccine: Declined   Covid-19:Declined  Advanced directives: Please bring a copy for your chart  Conditions/risks identified: See problem list  Next appointment: Follow up in one year for your annual wellness visit    Preventive Care 65 Years and Older, Female Preventive care refers to lifestyle choices and visits with your health care provider that can promote health and wellness. What does preventive care include? A yearly physical exam. This is also called an annual well check. Dental exams once or twice a year. Routine eye exams. Ask your health care provider how often you should have your eyes checked. Personal lifestyle choices, including: Daily care of your teeth and gums. Regular physical activity. Eating a healthy diet. Avoiding tobacco and drug use. Limiting alcohol use. Practicing safe sex. Taking low-dose aspirin every day. Taking vitamin and mineral supplements as recommended by your health care provider. What happens during an annual well check? The services and screenings done by your health care provider during your annual well check will depend on your age, overall health, lifestyle risk factors, and family history of disease. Counseling  Your health care provider may ask you questions  about your: Alcohol use. Tobacco use. Drug use. Emotional well-being. Home and relationship well-being. Sexual activity. Eating habits. History of falls. Memory and ability to understand (cognition). Work and work Statistician. Reproductive health. Screening  You may have the following tests or measurements: Height, weight, and BMI. Blood pressure. Lipid and cholesterol levels. These may be checked every 5 years, or more frequently if you are over 24 years old. Skin check. Lung cancer screening. You may have this screening every year starting at age 69 if you have a 30-pack-year history of smoking and currently smoke or have quit within the past 15 years. Fecal occult blood test (FOBT) of the stool. You may have this test every year starting at age 35. Flexible sigmoidoscopy or colonoscopy. You may have a sigmoidoscopy every 5 years or a colonoscopy every 10 years starting at age 82. Hepatitis C blood test. Hepatitis B blood test. Sexually transmitted disease (STD) testing. Diabetes screening. This is done by checking your blood sugar (glucose) after you have not eaten for a while (fasting). You may have this done every 1-3 years. Bone density scan. This is done to screen for osteoporosis. You may have this done starting at age 60. Mammogram. This may be done every 1-2 years. Talk to your health care provider about how often you should have regular mammograms. Talk with your health care provider about your test results, treatment options, and if necessary, the need for more tests. Vaccines  Your health care provider may recommend certain vaccines, such as: Influenza vaccine. This is recommended every year. Tetanus, diphtheria, and acellular pertussis (Tdap, Td) vaccine. You may need a Td booster every 10 years. Zoster vaccine. You may need this after age 41. Pneumococcal 13-valent conjugate (PCV13) vaccine. One dose is recommended after age 40. Pneumococcal polysaccharide (PPSV23)  vaccine. One dose is recommended  after age 6. Talk to your health care provider about which screenings and vaccines you need and how often you need them. This information is not intended to replace advice given to you by your health care provider. Make sure you discuss any questions you have with your health care provider. Document Released: 11/02/2015 Document Revised: 06/25/2016 Document Reviewed: 08/07/2015 Elsevier Interactive Patient Education  2017 [place] Prevention in the Home Falls can cause injuries. They can happen to people of all ages. There are many things you can do to make your home safe and to help prevent falls. What can I do on the outside of my home? Regularly fix the edges of walkways and driveways and fix any cracks. Remove anything that might make you trip as you walk through a door, such as a raised step or threshold. Trim any bushes or trees on the path to your home. Use bright outdoor lighting. Clear any walking paths of anything that might make someone trip, such as rocks or tools. Regularly check to see if handrails are loose or broken. Make sure that both sides of any steps have handrails. Any raised decks and porches should have guardrails on the edges. Have any leaves, snow, or ice cleared regularly. Use sand or salt on walking paths during winter. Clean up any spills in your garage right away. This includes oil or grease spills. What can I do in the bathroom? Use night lights. Install grab bars by the toilet and in the tub and shower. Do not use towel bars as grab bars. Use non-skid mats or decals in the tub or shower. If you need to sit down in the shower, use a plastic, non-slip stool. Keep the floor dry. Clean up any water that spills on the floor as soon as it happens. Remove soap buildup in the tub or shower regularly. Attach bath mats securely with double-sided non-slip rug tape. Do not have throw rugs and other things on the floor that  can make you trip. What can I do in the bedroom? Use night lights. Make sure that you have a light by your bed that is easy to reach. Do not use any sheets or blankets that are too big for your bed. They should not hang down onto the floor. Have a firm chair that has side arms. You can use this for support while you get dressed. Do not have throw rugs and other things on the floor that can make you trip. What can I do in the kitchen? Clean up any spills right away. Avoid walking on wet floors. Keep items that you use a lot in easy-to-reach places. If you need to reach something above you, use a strong step stool that has a grab bar. Keep electrical cords out of the way. Do not use floor polish or wax that makes floors slippery. If you must use wax, use non-skid floor wax. Do not have throw rugs and other things on the floor that can make you trip. What can I do with my stairs? Do not leave any items on the stairs. Make sure that there are handrails on both sides of the stairs and use them. Fix handrails that are broken or loose. Make sure that handrails are as long as the stairways. Check any carpeting to make sure that it is firmly attached to the stairs. Fix any carpet that is loose or worn. Avoid having throw rugs at the top or bottom of the stairs. If you do  have throw rugs, attach them to the floor with carpet tape. Make sure that you have a light switch at the top of the stairs and the bottom of the stairs. If you do not have them, ask someone to add them for you. What else can I do to help prevent falls? Wear shoes that: Do not have high heels. Have rubber bottoms. Are comfortable and fit you well. Are closed at the toe. Do not wear sandals. If you use a stepladder: Make sure that it is fully opened. Do not climb a closed stepladder. Make sure that both sides of the stepladder are locked into place. Ask someone to hold it for you, if possible. Clearly mark and make sure that you  can see: Any grab bars or handrails. First and last steps. Where the edge of each step is. Use tools that help you move around (mobility aids) if they are needed. These include: Canes. Walkers. Scooters. Crutches. Turn on the lights when you go into a dark area. Replace any light bulbs as soon as they burn out. Set up your furniture so you have a clear path. Avoid moving your furniture around. If any of your floors are uneven, fix them. If there are any pets around you, be aware of where they are. Review your medicines with your doctor. Some medicines can make you feel dizzy. This can increase your chance of falling. Ask your doctor what other things that you can do to help prevent falls. This information is not intended to replace advice given to you by your health care provider. Make sure you discuss any questions you have with your health care provider. Document Released: 08/02/2009 Document Revised: 03/13/2016 Document Reviewed: 11/10/2014 Elsevier Interactive Patient Education  2017 [employer].

## 2021-05-27 NOTE — Progress Notes (Signed)
Subjective:   Danielle Daniel is a 70 y.o. female who presents for Medicare Annual (Subsequent) preventive examination.  I connected with Clariece today by telephone and verified that I am speaking with the correct person using two identifiers. Location patient: home Location provider: work Persons participating in the virtual visit: patient, Marine scientist.    I discussed the limitations, risks, security and privacy concerns of performing an evaluation and management service by telephone and the availability of in person appointments. I also discussed with the patient that there may be a patient responsible charge related to this service. The patient expressed understanding and verbally consented to this telephonic visit.    Interactive audio and video telecommunications were attempted between this provider and patient, however failed, due to patient having technical difficulties OR patient did not have access to video capability.  We continued and completed visit with audio only.  Some vital signs may be absent or patient reported.   Time Spent with patient on telephone encounter: 20 minutes   Review of Systems     Cardiac Risk Factors include: advanced age (>8mn, >>31women);dyslipidemia     Objective:    Today's Vitals   05/27/21 1341  Weight: 166 lb (75.3 kg)  Height: '5\' 6"'$  (1.676 m)   Body mass index is 26.79 kg/m.  Advanced Directives 05/27/2021 03/04/2018 08/28/2016 08/13/2016  Does Patient Have a Medical Advance Directive? No Yes Yes Yes  Type of Advance Directive - [place] will  Does patient want to make changes to medical advance directive? - - No - Patient declined -  Copy of [place] Chart? - No - copy requested No - copy requested -  Would patient like information on creating a medical advance directive? Yes (MAU/Ambulatory/Procedural Areas -  Information given) - - -    Current Medications (verified) Outpatient Encounter Medications as of 05/27/2021  Medication Sig   acyclovir (ZOVIRAX) 800 MG tablet Take 800 mg TID for 2 days as needed for HSV recurrence   Ascorbic Acid (VITAMIN C) 1000 MG tablet Take 1,000 mg by mouth daily.   Calcium-Magnesium-Vitamin D 600-40-500 MG-MG-UNIT TB24 Take 1 tablet by mouth daily.   Coenzyme Q10 (COQ10 PO) Take 100 mg by mouth.    Inositol Niacinate 500 MG CAPS Take 1 capsule by mouth daily.   Multiple Vitamins-Minerals (WOMENS 50+ MULTI VITAMIN/MIN PO) Take by mouth.   Omega-3 Fatty Acids (FISH OIL) 1200 MG CAPS Take by mouth.   omeprazole (PRILOSEC) 20 MG capsule Take 1 capsule (20 mg total) by mouth daily.   QUERCETIN PO Take 500 mg by mouth daily.   Red Yeast Rice 600 MG CAPS Take 2 capsules by mouth daily.    Selenium 200 MCG CAPS Take by mouth.   No facility-administered encounter medications on file as of 05/27/2021.    Allergies (verified) Cortisone   History: Past Medical History:  Diagnosis Date   Cataract    left eye   Constipation    GERD (gastroesophageal reflux disease)    zantac prn   HSV (herpes simplex virus) infection    Hyperlipidemia    tx red yeast rice - now normal per patient   Past Surgical History:  Procedure Laterality Date   APPENDECTOMY     CATARACT EXTRACTION Left 04/2016   sanders   EXPLORATORY LAPAROTOMY     RETINAL DETACHMENT SURGERY     VITRECTOMY Left  VITRECTOMY AND CATARACT Left    WISDOM TOOTH EXTRACTION     Family History  Problem Relation Age of Onset   Heart disease Mother        CHF   Heart disease Sister        arrythmia   Hypertension Sister    Heart disease Sister        arrythmia   Hypertension Sister    Colon cancer Neg Hx    Esophageal cancer Neg Hx    Rectal cancer Neg Hx    Stomach cancer Neg Hx    Breast cancer Neg Hx    Social History   Socioeconomic History   Marital status: Single    Spouse name: Not on  file   Number of children: Not on file   Years of education: Not on file   Highest education level: Not on file  Occupational History   Not on file  Tobacco Use   Smoking status: Never   Smokeless tobacco: Never  Substance and Sexual Activity   Alcohol use: Yes    Comment: occasionally beer/wine   Drug use: No   Sexual activity: Not Currently    Birth control/protection: Post-menopausal  Other Topics Concern   Not on file  Social History Narrative   Not on file   Social Determinants of Health   Financial Resource Strain: Low Risk    Difficulty of Paying Living Expenses: Not hard at all  Food Insecurity: No Food Insecurity   Worried About Charity fundraiser in the Last Year: Never true   Ran Out of Food in the Last Year: Never true  Transportation Needs: No Transportation Needs   Lack of Transportation (Medical): No   Lack of Transportation (Non-Medical): No  Physical Activity: Sufficiently Active   Days of Exercise per Week: 3 days   Minutes of Exercise per Session: 50 min  Stress: No Stress Concern Present   Feeling of Stress : Not at all  Social Connections: Moderately Integrated   Frequency of Communication with Friends and Family: More than three times a week   Frequency of Social Gatherings with Friends and Family: More than three times a week   Attends Religious Services: 1 to 4 times per year   Active Member of [employer] or Organizations: Yes   Attends Archivist Meetings: 1 to 4 times per year   Marital Status: Never married    Tobacco Counseling Counseling given: Not Answered   Clinical Intake:  Pre-visit preparation completed: Yes  Pain : No/denies pain     Nutritional Status: BMI 25 -29 Overweight Nutritional Risks: None Diabetes: No  How often do you need to have someone help you when you read instructions, pamphlets, or other written materials from your doctor or pharmacy?: 1 - Never  Diabetic?No  Interpreter Needed?:  No  Information entered by :: Caroleen Hamman LPN   Activities of Daily Living In your present state of health, do you have any difficulty performing the following activities: 05/27/2021  Hearing? N  Vision? N  Difficulty concentrating or making decisions? N  Walking or climbing stairs? N  Dressing or bathing? N  Doing errands, shopping? N  Preparing Food and eating ? N  Using the Toilet? N  In the past six months, have you accidently leaked urine? Y  Comment occasionally with coughing  Do you have problems with loss of bowel control? N  Managing your Medications? N  Managing your Finances? N  Housekeeping or managing  your Housekeeping? N  Some recent data might be hidden    Patient Care Team: Copland, Gay Filler, MD as PCP - General (Family Medicine) Sherlynn Stalls, MD as Consulting Physician (Ophthalmology) Armbruster, Carlota Raspberry, MD as Consulting Physician (Gastroenterology) Monna Fam, MD as Consulting Physician (Ophthalmology)  Indicate any recent Medical Services you may have received from other than [facility] providers in the past year (date may be approximate).     Assessment:   This is a routine wellness examination for Tailey.  Hearing/Vision screen Hearing Screening - Comments:: No issues Vision Screening - Comments:: Last eye exam-02/2021-Dr. Herbert Deaner  Dietary issues and exercise activities discussed: Current Exercise Habits: The patient does not participate in regular exercise at present;Home exercise routine, Type of exercise: Other - see comments (exercise bike & yard work), Time (Minutes): 45, Frequency (Times/Week): 3, Weekly Exercise (Minutes/Week): 135, Intensity: Mild, Exercise limited by: None identified   Goals Addressed             This Visit's Progress    Patient Stated       Increase physical activity       Depression Screen PHQ 2/9 Scores 05/27/2021 05/08/2021 03/04/2018 08/28/2016 08/27/2015 06/09/2014 05/19/2013  PHQ - 2 Score 0 0 0 0 0 0 0     Fall Risk Fall Risk  05/27/2021 05/08/2021 03/04/2018 08/28/2016 08/27/2015  Falls in the past year? 0 0 No Yes No  Number falls in past yr: 0 0 - - -  Injury with Fall? 0 0 - - -  Follow up Falls prevention discussed - - - -    FALL RISK PREVENTION PERTAINING TO THE HOME:  Any stairs in or around the home? Yes  If so, are there any without handrails? No  Home free of loose throw rugs in walkways, pet beds, electrical cords, etc? Yes  Adequate lighting in your home to reduce risk of falls? Yes   ASSISTIVE DEVICES UTILIZED TO PREVENT FALLS:  Life alert? No  Use of a cane, walker or w/c? No  Grab bars in the bathroom? No  Shower chair or bench in shower? No  Elevated toilet seat or a handicapped toilet? No   TIMED UP AND GO:  Was the test performed? No . Phone visit   Cognitive Function:Normal cognitive status assessed by this Nurse Health Advisor. No abnormalities found.   MMSE - Mini Mental State Exam 08/28/2016  Orientation to time 5  Orientation to Place 5  Registration 3  Attention/ Calculation 5  Recall 3  Language- name 2 objects 2  Language- repeat 1  Language- follow 3 step command 3  Language- read & follow direction 1  Write a sentence 1  Copy design 1  Total score 30        Immunizations Immunization History  Administered Date(s) Administered   Td 10/20/1998, 12/12/2008, 09/07/2019    TDAP status: Up to date  Flu vaccine status: Due 06/2021  Pneumococcal vaccine status: Due, Education has been provided regarding the importance of this vaccine. Advised may receive this vaccine at local pharmacy or Health Dept. Aware to provide a copy of the vaccination record if obtained from local pharmacy or Health Dept. Verbalized acceptance and understanding.  Covid-19 vaccine status: Information provided on how to obtain vaccines.   Qualifies for Shingles Vaccine? Yes   Zostavax completed No   Shingrix Completed?: No.    Education has been provided regarding the  importance of this vaccine. Patient has been advised to call insurance company to  determine out of pocket expense if they have not yet received this vaccine. Advised may also receive vaccine at local pharmacy or Health Dept. Verbalized acceptance and understanding.  Screening Tests Health Maintenance  Topic Date Due   INFLUENZA VACCINE  05/20/2021   COVID-19 Vaccine (1) 06/12/2021 (Originally 05/15/1956)   Zoster Vaccines- Shingrix (1 of 2) 08/27/2021 (Originally 05/15/2001)   PNA vac Low Risk Adult (1 of 2 - PCV13) 05/27/2022 (Originally 05/15/2016)   COLONOSCOPY (Pts 45-93yr Insurance coverage will need to be confirmed)  08/27/2021   MAMMOGRAM  09/05/2021   DEXA SCAN  09/21/2022   TETANUS/TDAP  09/06/2029   Hepatitis C Screening  Completed   HPV VACCINES  Aged Out    Health Maintenance  Health Maintenance Due  Topic Date Due   INFLUENZA VACCINE  05/20/2021    Colorectal cancer screening: Type of screening: Colonoscopy. Completed 08/27/2016. Repeat every 5 years  Mammogram status: Completed Bilateral 09/05/2020. Repeat every year  Bone Density status: Completed 09/21/2020. Results reflect: Bone density results: NORMAL. Repeat every 2 years.  Lung Cancer Screening: (Low Dose CT Chest recommended if Age 70-80years, 30 pack-year currently smoking OR have quit w/in 15years.) does not qualify.     Additional Screening:  Hepatitis C Screening:  Completed 08/31/2017  Vision Screening: Recommended annual ophthalmology exams for early detection of glaucoma and other disorders of the eye. Is the patient up to date with their annual eye exam?  Yes  Who is the provider or what is the name of the office in which the patient attends annual eye exams? Dr. HHerbert Deaner  Dental Screening: Recommended annual dental exams for proper oral hygiene  Community Resource Referral / Chronic Care Management: CRR required this visit?  No   CCM required this visit?  No      Plan:     I have  personally reviewed and noted the following in the patient's chart:   Medical and social history Use of alcohol, tobacco or illicit drugs  Current medications and supplements including opioid prescriptions.  Functional ability and status Nutritional status Physical activity Advanced directives List of other physicians Hospitalizations, surgeries, and ER visits in previous 12 months Vitals Screenings to include cognitive, depression, and falls Referrals and appointments  In addition, I have reviewed and discussed with patient certain preventive protocols, quality metrics, and best practice recommendations. A written personalized care plan for preventive services as well as general preventive health recommendations were provided to patient.   Due to this being a telephonic visit, the after visit summary with patients personalized plan was offered to patient via mail or my-chart. Patient would like to access on my-chart.   MMarta Antu LPN   [id number] Nurse Health Advisor  Nurse Notes: None

## 2021-06-27 DIAGNOSIS — H35033 Hypertensive retinopathy, bilateral: Secondary | ICD-10-CM | POA: Diagnosis not present

## 2021-06-27 DIAGNOSIS — H04123 Dry eye syndrome of bilateral lacrimal glands: Secondary | ICD-10-CM | POA: Diagnosis not present

## 2021-06-27 DIAGNOSIS — H31092 Other chorioretinal scars, left eye: Secondary | ICD-10-CM | POA: Diagnosis not present

## 2021-06-27 DIAGNOSIS — H26491 Other secondary cataract, right eye: Secondary | ICD-10-CM | POA: Diagnosis not present

## 2021-06-27 DIAGNOSIS — H40023 Open angle with borderline findings, high risk, bilateral: Secondary | ICD-10-CM | POA: Diagnosis not present

## 2021-06-27 DIAGNOSIS — H35373 Puckering of macula, bilateral: Secondary | ICD-10-CM | POA: Diagnosis not present

## 2021-07-23 ENCOUNTER — Other Ambulatory Visit: Payer: Self-pay | Admitting: Family Medicine

## 2021-07-23 DIAGNOSIS — Z1231 Encounter for screening mammogram for malignant neoplasm of breast: Secondary | ICD-10-CM

## 2021-09-06 ENCOUNTER — Ambulatory Visit
Admission: RE | Admit: 2021-09-06 | Discharge: 2021-09-06 | Disposition: A | Discharge status: Home / Self Care | Payer: Medicare HMO | Source: Ambulatory Visit | Attending: Family Medicine | Admitting: Family Medicine

## 2021-09-06 ENCOUNTER — Other Ambulatory Visit: Payer: Self-pay

## 2021-09-06 DIAGNOSIS — Z1231 Encounter for screening mammogram for malignant neoplasm of breast: Secondary | ICD-10-CM

## 2021-09-14 NOTE — Progress Notes (Addendum)
[facility] [place], [place], [place] [address] [phone]  Date:  09/19/2021   Name:  Danielle Daniel   DOB:  07-Mar-1951   MRN:  [id number]  PCP:  Darreld Mclean, MD    Chief Complaint: Annual Exam (Concerns/ questions: none/Flu shot today: declines/)   History of Present Illness:  Danielle Daniel is a 70 y.o. very pleasant female patient who presents with the following:  Pt seen today for a CPE-  history of GERD, dyslipidemia treated with red yeast rice and niacin Last visit with myself was in July for concern of a prominent appearing vein in her right foot  At our last visit she declined all recommended immunizations  Covid series- recommend - Shingrix- recommend  Pneumonia- pt declines  Flu- declines  Colonoscopy- 2017; due for update this year?  Mammo UTD Dexa one year ago Labs one year ago- she is fasting this am   She is taking a PPI- she has tried H2 blockers and they did not work.  She notes that her PPI is really necessary for her quality of life  She would like to try a 10 mg strength and see if this works for her   She is doing a lot of yard work- this is her main exercise No Cp or SOB,no PMB  BP Readings from Last 3 Encounters:  09/19/21 138/85  05/08/21 138/80  09/19/20 122/80     Patient Active Problem List   Diagnosis Date Noted   Eustachian tube dysfunction 10/10/2014   Lymphadenopathy 10/10/2014   Trapezius muscle spasm 05/19/2013   VISUAL CHANGES 12/20/2010   GERD 11/07/2010   DYSPHAGIA 11/07/2010   CONSTIPATION, CHRONIC 12/18/2009   HAND SPRAIN 10/27/2007    Past Medical History:  Diagnosis Date   Cataract    left eye   Constipation    GERD (gastroesophageal reflux disease)    zantac prn   HSV (herpes simplex virus) infection    Hyperlipidemia    tx red yeast rice - now normal per patient    Past Surgical History:  Procedure Laterality Date    APPENDECTOMY     CATARACT EXTRACTION Left 04/2016   sanders   EXPLORATORY LAPAROTOMY     RETINAL DETACHMENT SURGERY     VITRECTOMY Left    VITRECTOMY AND CATARACT Left    WISDOM TOOTH EXTRACTION      Social History   Tobacco Use   Smoking status: Never   Smokeless tobacco: Never  Substance Use Topics   Alcohol use: Yes    Comment: occasionally beer/wine   Drug use: No    Family History  Problem Relation Age of Onset   Heart disease Mother        CHF   Heart disease Sister        arrythmia   Hypertension Sister    Heart disease Sister        arrythmia   Hypertension Sister    Colon cancer Neg Hx    Esophageal cancer Neg Hx    Rectal cancer Neg Hx    Stomach cancer Neg Hx    Breast cancer Neg Hx     Allergies  Allergen Reactions   Cortisone Rash    Medication list has been reviewed and updated.  Current Outpatient Medications on File Prior to Visit  Medication Sig Dispense Refill   acyclovir (ZOVIRAX) 800 MG tablet Take 800 mg TID for  2 days as needed for HSV recurrence 30 tablet 1   Ascorbic Acid (VITAMIN C) 1000 MG tablet Take 1,000 mg by mouth daily.     Calcium-Magnesium-Vitamin D 600-40-500 MG-MG-UNIT TB24 Take 1 tablet by mouth daily.     Coenzyme Q10 (COQ10 PO) Take 100 mg by mouth.      Inositol Niacinate 500 MG CAPS Take 1 capsule by mouth daily.     Multiple Vitamins-Minerals (WOMENS 50+ MULTI VITAMIN/MIN PO) Take by mouth.     Omega-3 Fatty Acids (FISH OIL) 1200 MG CAPS Take by mouth.     omeprazole (PRILOSEC) 20 MG capsule Take 1 capsule (20 mg total) by mouth daily. 90 capsule 1   QUERCETIN PO Take 500 mg by mouth daily.     Red Yeast Rice 600 MG CAPS Take 2 capsules by mouth daily.      Selenium 200 MCG CAPS Take by mouth.     No current facility-administered medications on file prior to visit.    Review of Systems:  As per HPI- otherwise negative.   Physical Examination: Vitals:   09/19/21 0818 09/19/21 0832  BP: (!) 160/92 138/85   Pulse: 80   Resp: 18   Temp: 98.2 F (36.8 C)   SpO2: 100%    Vitals:   09/19/21 0818  Weight: 166 lb 9.6 oz (75.6 kg)  Height: 5\' 6"  (1.676 m)   Body mass index is 26.89 kg/m. Ideal Body Weight: Weight in (lb) to have BMI = 25: 154.6  GEN: no acute distress.  Mild overweight, looks well  HEENT: Atraumatic, Normocephalic.  Bilateral TM wnl, oropharynx normal.  PEERL,EOMI.   Ears and Nose: No external deformity. CV: RRR, No M/G/R. No JVD. No thrill. No extra heart sounds. PULM: CTA B, no wheezes, crackles, rhonchi. No retractions. No resp. distress. No accessory muscle use. ABD: S, NT, ND. No rebound. No HSM. EXTR: No c/c/e PSYCH: Normally interactive. Conversant.    Assessment and Plan: Physical exam  Screening for diabetes mellitus - Plan: Comprehensive metabolic panel, Hemoglobin A1c  Screening for deficiency anemia - Plan: CBC  Elevated TSH - Plan: TSH  Screening for hyperlipidemia - Plan: Lipid panel  Gastroesophageal reflux disease, unspecified whether esophagitis present - Plan: omeprazole (PRILOSEC) 10 MG capsule  Fatigue, unspecified type - Plan: TSH, Vitamin D (25 hydroxy)  Multiple nevi - Plan: Ambulatory referral to Dermatology  Physical exam today Encourage healthy diet and exercise routine Pt will monitor her BP at home and let me know if elevated Try omeprazole 10- can change to this lower dose if successful   Signed Lamar Blinks, MD  Received her labs as below, message to patient  Results for orders placed or performed in visit on 09/19/21  CBC  Result Value Ref Range   WBC 7.2 4.0 - 10.5 K/uL   RBC 4.31 3.87 - 5.11 Mil/uL   Platelets 307.0 150.0 - 400.0 K/uL   Hemoglobin 13.7 12.0 - 15.0 g/dL   HCT 41.2 36.0 - 46.0 %   MCV 95.6 78.0 - 100.0 fl   MCHC 33.2 30.0 - 36.0 g/dL   RDW 13.3 11.5 - 15.5 %  Comprehensive metabolic panel  Result Value Ref Range   Sodium 141 135 - 145 mEq/L   Potassium 4.0 3.5 - 5.1 mEq/L   Chloride 102 96  - 112 mEq/L   CO2 32 19 - 32 mEq/L   Glucose, Bld 80 70 - 99 mg/dL   BUN 10 6 - 23 mg/dL  Creatinine, Ser 0.89 0.40 - 1.20 mg/dL   Total Bilirubin 0.6 0.2 - 1.2 mg/dL   Alkaline Phosphatase 82 39 - 117 U/L   AST 22 0 - 37 U/L   ALT 24 0 - 35 U/L   Total Protein 7.1 6.0 - 8.3 g/dL   Albumin 4.3 3.5 - 5.2 g/dL   GFR 65.72 >60.00 mL/min   Calcium 9.4 8.4 - 10.5 mg/dL  Hemoglobin A1c  Result Value Ref Range   Hgb A1c MFr Bld 5.5 4.6 - 6.5 %  Lipid panel  Result Value Ref Range   Cholesterol 222 (H) 0 - 200 mg/dL   Triglycerides 73.0 0.0 - 149.0 mg/dL   HDL 71.80 >39.00 mg/dL   VLDL 14.6 0.0 - 40.0 mg/dL   LDL Cholesterol 136 (H) 0 - 99 mg/dL   Total CHOL/HDL Ratio 3    NonHDL 150.34   TSH  Result Value Ref Range   TSH 5.97 (H) 0.35 - 5.50 uIU/mL  Vitamin D (25 hydroxy)  Result Value Ref Range   VITD 56.16 30.00 - 100.00 ng/mL

## 2021-09-19 ENCOUNTER — Encounter: Payer: Self-pay | Admitting: Family Medicine

## 2021-09-19 ENCOUNTER — Ambulatory Visit (INDEPENDENT_AMBULATORY_CARE_PROVIDER_SITE_OTHER): Payer: Medicare HMO | Admitting: Family Medicine

## 2021-09-19 ENCOUNTER — Other Ambulatory Visit: Payer: Self-pay

## 2021-09-19 VITALS — BP 138/85 | HR 80 | Temp 98.2°F | Resp 18 | Ht 66.0 in | Wt 166.6 lb

## 2021-09-19 DIAGNOSIS — Z13220 Encounter for screening for lipoid disorders: Secondary | ICD-10-CM

## 2021-09-19 DIAGNOSIS — Z0000 Encounter for general adult medical examination without abnormal findings: Secondary | ICD-10-CM | POA: Diagnosis not present

## 2021-09-19 DIAGNOSIS — K219 Gastro-esophageal reflux disease without esophagitis: Secondary | ICD-10-CM | POA: Diagnosis not present

## 2021-09-19 DIAGNOSIS — Z131 Encounter for screening for diabetes mellitus: Secondary | ICD-10-CM

## 2021-09-19 DIAGNOSIS — R7989 Other specified abnormal findings of blood chemistry: Secondary | ICD-10-CM | POA: Diagnosis not present

## 2021-09-19 DIAGNOSIS — Z130 Encounter for screening for diseases of the blood and blood-forming organs and certain disorders involving the immune mechanism: Secondary | ICD-10-CM | POA: Diagnosis not present

## 2021-09-19 DIAGNOSIS — R5383 Other fatigue: Secondary | ICD-10-CM | POA: Diagnosis not present

## 2021-09-19 DIAGNOSIS — D229 Melanocytic nevi, unspecified: Secondary | ICD-10-CM | POA: Diagnosis not present

## 2021-09-19 LAB — COMPREHENSIVE METABOLIC PANEL
ALT: 24 U/L (ref 0–35)
AST: 22 U/L (ref 0–37)
Albumin: 4.3 g/dL (ref 3.5–5.2)
Alkaline Phosphatase: 82 U/L (ref 39–117)
BUN: 10 mg/dL (ref 6–23)
CO2: 32 mEq/L (ref 19–32)
Calcium: 9.4 mg/dL (ref 8.4–10.5)
Chloride: 102 mEq/L (ref 96–112)
Creatinine, Ser: 0.89 mg/dL (ref 0.40–1.20)
GFR: 65.72 mL/min (ref >60.00)
Glucose, Bld: 80 mg/dL (ref 70–99)
Potassium: 4 mEq/L (ref 3.5–5.1)
Sodium: 141 mEq/L (ref 135–145)
Total Bilirubin: 0.6 mg/dL (ref 0.2–1.2)
Total Protein: 7.1 g/dL (ref 6.0–8.3)

## 2021-09-19 LAB — CBC
HCT: 41.2 % (ref 36.0–46.0)
Hemoglobin: 13.7 g/dL (ref 12.0–15.0)
MCHC: 33.2 g/dL (ref 30.0–36.0)
MCV: 95.6 fl (ref 78.0–100.0)
Platelets: 307 10*3/uL (ref 150.0–400.0)
RBC: 4.31 Mil/uL (ref 3.87–5.11)
RDW: 13.3 % (ref 11.5–15.5)
WBC: 7.2 10*3/uL (ref 4.0–10.5)

## 2021-09-19 LAB — LIPID PANEL
Cholesterol: 222 mg/dL — ABNORMAL HIGH (ref 0–200)
HDL: 71.8 mg/dL (ref >39.00)
LDL Cholesterol: 136 mg/dL — ABNORMAL HIGH (ref 0–99)
NonHDL: 150.34
Total CHOL/HDL Ratio: 3
Triglycerides: 73 mg/dL (ref 0.0–149.0)
VLDL: 14.6 mg/dL (ref 0.0–40.0)

## 2021-09-19 LAB — VITAMIN D 25 HYDROXY (VIT D DEFICIENCY, FRACTURES): VITD: 56.16 ng/mL (ref 30.00–100.00)

## 2021-09-19 LAB — HEMOGLOBIN A1C: Hgb A1c MFr Bld: 5.5 % (ref 4.6–6.5)

## 2021-09-19 LAB — TSH: TSH: 5.97 u[IU]/mL — ABNORMAL HIGH (ref 0.35–5.50)

## 2021-09-19 MED ORDER — OMEPRAZOLE 10 MG PO CPDR: 10.0000 mg | DELAYED_RELEASE_CAPSULE | Freq: Every day | ORAL | 3 refills | Status: DC

## 2021-09-19 NOTE — Patient Instructions (Signed)
Good to see you- I will be in touch with your labs Please check with GI about your colonoscopy- you may be due  Address: [address], [place], [place] [address] Phone: [phone]  We will set you up to see dermatology for a skin check- it will likely be 6+ months!  Let me know how you do on the omeprazole 10- if this is sufficient ok to decrease dose long term  BP goal < 140/90  I do recommend routine flu, pneumonia, shingles and covid vaccination

## 2021-09-27 ENCOUNTER — Telehealth: Payer: Self-pay | Admitting: Gastroenterology

## 2021-09-27 NOTE — Telephone Encounter (Signed)
Tried to reach pt by phone.  It sounded like someone answered the phone but never spoke.  I said hello several times with no response.  Will try later

## 2021-09-27 NOTE — Telephone Encounter (Signed)
Patient called and stated that she knows she needs to come in for a colonoscopy but she does not like having them done and has really bad pain after having them. Seeking advise if she has to get it done, or not. Please advise.

## 2021-09-30 ENCOUNTER — Telehealth: Payer: Self-pay | Admitting: Physician Assistant

## 2021-09-30 NOTE — Telephone Encounter (Signed)
Thanks [employer]. Yes, she had 2 small adenomas on her last exam so it is okay to extend her surveillance to 7 years, so 08/2023.  She is not a candidate for a Cologuard given her history of polyps. I think we could get her through an exam and withdraw all air at the end of her next exam to minimize risk for trapped air, I do that for patient's who have had that in the past and works well. Up to her ultimately on how she wishes to proceed. She can see me in 2 years to discuss beforehand in the office if she would prefer. Thanks

## 2021-09-30 NOTE — Telephone Encounter (Signed)
NP scheduled 03/26/22 w/KRS. Please mark as referral from Dr. Leanord Hawking [facility]

## 2021-09-30 NOTE — Telephone Encounter (Signed)
Pt returned call. She states that she was talking with her PCP and they had mentioned that come 5 year recalls are now 7 years. Advised that the recall date is based on the type of polyps, amount of polyps, size of polyps, etc. Pt wanted to know if you could review her 2017 pathology again to see if her recall can be extended. Pt is also interested in Cologuard. Advised that Cologuard should be for screening, and she has a history of polyps so it may come up positive and essentially she would still need a colonoscopy. Pt states that she had a very bad time with colonoscopy. She reports that she has a tortuous colon and states that she had to go back under to have the "gas sucked out" after the procedure. Please advise, thanks.

## 2021-09-30 NOTE — Telephone Encounter (Signed)
Referral attached to appointment

## 2021-09-30 NOTE — Telephone Encounter (Signed)
Lm on vm for patient to return call 

## 2021-09-30 NOTE — Telephone Encounter (Signed)
Left voicemail for patient to call back. 

## 2021-10-01 NOTE — Telephone Encounter (Signed)
Called and spoke with patient in regards to Dr. Doyne Keel recommendations. Pt would like to see him in the office in 08/2023 to discuss. Pt is aware that she will receive a reminder letter closer to the time of her recall. Pt is aware that it may be sent through my chart or they may place the letter in the mail. Pt verbalized understanding and had no concerns at the end of the call.   New recall in epic.

## 2021-11-01 ENCOUNTER — Telehealth: Payer: Self-pay | Admitting: Family Medicine

## 2021-11-01 NOTE — Telephone Encounter (Signed)
Pt called and stated her bp is 175/66. Pt declined traige transfer. Pt would like for cma to return her call to discuss further information and possibly bring in her bp monitor to be checked. Please advise.

## 2021-11-04 NOTE — Telephone Encounter (Signed)
Pt was seen on 09/19/21 for CPE: was advised that day to inform us if her Bps are elevated.

## 2021-11-04 NOTE — Telephone Encounter (Signed)
Last seen in December  BP Readings from Last 3 Encounters:  09/19/21 138/85  05/08/21 138/80  09/19/20 122/80   Pt reports her BP was high at home - 175/66.  She is not sure if this was just her cuff or if really high She is getting nervous when she will check her BP It was only high as above one time   We scheduled her for a nurse visit Wednesday afternoon- she will bring in her home cuff as well

## 2021-11-06 ENCOUNTER — Ambulatory Visit (INDEPENDENT_AMBULATORY_CARE_PROVIDER_SITE_OTHER): Payer: Medicare HMO

## 2021-11-06 VITALS — BP 172/80 | HR 96

## 2021-11-06 DIAGNOSIS — I10 Essential (primary) hypertension: Secondary | ICD-10-CM | POA: Diagnosis not present

## 2021-11-06 MED ORDER — LISINOPRIL 10 MG PO TABS: 10.0000 mg | ORAL_TABLET | Freq: Every day | ORAL | 3 refills | Status: DC

## 2021-11-06 NOTE — Progress Notes (Signed)
Pt here for Blood pressure check per PCP.   [name]    Pt reports compliance with medication.  [name], [name]   Danielle Daniel

## 2021-11-08 ENCOUNTER — Encounter: Payer: Self-pay | Admitting: Family Medicine

## 2021-11-12 NOTE — Progress Notes (Signed)
[facility] [address], [place], [place] [address] [phone]  Date:  11/14/2021   Name:  Danielle Daniel   DOB:  1951/02/18   MRN:  [id number]  PCP:  Darreld Mclean, MD    Chief Complaint: HTN follow up (Concerns/ questions: pt has some papers for todays OV. /)   History of Present Illness:  Danielle Daniel is a 71 y.o. very pleasant female patient who presents with the following:  Patient seen today for follow-up of blood pressure Most recent visit with myself was in December for physical exam In the meantime she contacted [place] with concern of high blood pressure. She was seen in the office for nurse visit and noted to have moderately elevated blood pressure as below.  I had her start on lisinopril 10 mg  BP Readings from Last 3 Encounters:  11/14/21 139/76  11/06/21 (!) 172/80  09/19/21 138/85    She was then concerned that her blood pressure was too low so I had her decrease to 5 mg She is currently taking 5 mg; she notes her blood pressure is well controlled.  Running approximately 140/75 No side effects or cough She also read an article [facility] about angioedema risk with ACE inhibitor's, this got her very concerned  She is doing ok on 10 mg of omeprazole- this is controlling her GERD sx     Patient Active Problem List   Diagnosis Date Noted   Eustachian tube dysfunction 10/10/2014   Lymphadenopathy 10/10/2014   Trapezius muscle spasm 05/19/2013   VISUAL CHANGES 12/20/2010   GERD 11/07/2010   DYSPHAGIA 11/07/2010   CONSTIPATION, CHRONIC 12/18/2009   HAND SPRAIN 10/27/2007    Past Medical History:  Diagnosis Date   Cataract    left eye   Constipation    GERD (gastroesophageal reflux disease)    zantac prn   HSV (herpes simplex virus) infection    Hyperlipidemia    tx red yeast rice - now normal per patient    Past Surgical History:  Procedure Laterality Date    APPENDECTOMY     CATARACT EXTRACTION Left 04/2016   [facility]   EXPLORATORY LAPAROTOMY     RETINAL DETACHMENT SURGERY     VITRECTOMY Left    VITRECTOMY AND CATARACT Left    WISDOM TOOTH EXTRACTION      Social History   Tobacco Use   Smoking status: Never   Smokeless tobacco: Never  Substance Use Topics   Alcohol use: Yes    Comment: occasionally beer/wine   Drug use: No    Family History  Problem Relation Age of Onset   Heart disease Mother        CHF   Heart disease Sister        arrythmia   Hypertension Sister    Heart disease Sister        arrythmia   Hypertension Sister    Colon cancer Neg Hx    Esophageal cancer Neg Hx    Rectal cancer Neg Hx    Stomach cancer Neg Hx    Breast cancer Neg Hx     Allergies  Allergen Reactions   Cortisone Rash    Medication list has been reviewed and updated.  Current Outpatient Medications on File Prior to Visit  Medication Sig Dispense Refill   acyclovir (ZOVIRAX) 800 MG tablet Take 800 mg TID for 2 days as needed for HSV  recurrence 30 tablet 1   Ascorbic Acid (VITAMIN C) 1000 MG tablet Take 1,000 mg by mouth daily.     Citrus Bergamot 650 MG TABS      Coenzyme Q10 (COQ10 PO) Take 100 mg by mouth.      COLLAGEN PO      Garlic 4163 MG CAPS      Inositol Niacinate 500 MG CAPS Take 1 capsule by mouth daily.     lisinopril (ZESTRIL) 10 MG tablet Take 1 tablet (10 mg total) by mouth daily. 90 tablet 3   Magnesium 400 MG CAPS      Omega-3 Fatty Acids (FISH OIL) 1200 MG CAPS Take by mouth.     QUERCETIN PO Take 500 mg by mouth daily.     Red Yeast Rice 600 MG CAPS Take 2 capsules by mouth daily.      Selenium 200 MCG CAPS Take by mouth.     Zinc 50 MG TABS      No current facility-administered medications on file prior to visit.    Review of Systems:  As per HPI- otherwise negative.   Physical Examination: Vitals:   11/14/21 1542  BP: 139/76  Pulse: 89  Resp: 18  Temp: 98.3 F (36.8 C)  SpO2: 99%   There were  no vitals filed for this visit. There is no height or weight on file to calculate BMI. Ideal Body Weight:    GEN: no acute distress.  Looks well HEENT: Atraumatic, Normocephalic.  Ears and Nose: No external deformity. CV: RRR, No M/G/R. No JVD. No thrill. No extra heart sounds. PULM: CTA B, no wheezes, crackles, rhonchi. No retractions. No resp. distress. No accessory muscle use. EXTR: No c/c/e PSYCH: Normally interactive. Conversant.    Assessment and Plan: Hypertension, unspecified type  Gastroesophageal reflux disease, unspecified whether esophagitis present - Plan: omeprazole (PRILOSEC) 10 MG capsule  Patient is here today to recheck her high blood pressure.  She is currently taking 5 mg of lisinopril.  This seems to be doing an adequate job of controlling her pressure, suspect some of her high blood pressure is due to anxiety.  She has gotten quite worried about her blood pressure, is checking it frequently and admits she is " freaking out" about her blood pressures We discussed angioedema risk with lisinopril.  I advised her this is a rare allergic reaction, but if she does not feel comfortable we can do a different medication.  For the time being she feels comfortable continuing lisinopril.  I encouraged her to check her blood pressure less frequently, and reassured her that mildly high or low blood pressures are unlikely to cause her any harm.  Certainly she may contact me if she is concerned about her blood pressures via MyChart  Asked her to follow-up in 4 to 6 months  Signed Lamar Blinks, MD

## 2021-11-14 ENCOUNTER — Ambulatory Visit (INDEPENDENT_AMBULATORY_CARE_PROVIDER_SITE_OTHER): Payer: Medicare HMO | Admitting: Family Medicine

## 2021-11-14 VITALS — BP 139/76 | HR 89 | Temp 98.3°F | Resp 18

## 2021-11-14 DIAGNOSIS — K219 Gastro-esophageal reflux disease without esophagitis: Secondary | ICD-10-CM

## 2021-11-14 DIAGNOSIS — I10 Essential (primary) hypertension: Secondary | ICD-10-CM | POA: Diagnosis not present

## 2021-11-14 MED ORDER — OMEPRAZOLE 10 MG PO CPDR: 10.0000 mg | DELAYED_RELEASE_CAPSULE | Freq: Every day | ORAL | 3 refills | Status: DC

## 2021-11-14 NOTE — Patient Instructions (Addendum)
Continue 5 mg of lisinopril for your BP- if you are concerned that your BP is too high you can go back up to 10 mg.  Please try to stop checking your BP so often- I am afraid it is causing anxiety and is not necessary  Take care Let's visit in 4-6 months to check on your BP and labs

## 2021-11-15 ENCOUNTER — Encounter: Payer: Self-pay | Admitting: Family Medicine

## 2021-12-26 DIAGNOSIS — H40023 Open angle with borderline findings, high risk, bilateral: Secondary | ICD-10-CM | POA: Diagnosis not present

## 2021-12-26 DIAGNOSIS — H35033 Hypertensive retinopathy, bilateral: Secondary | ICD-10-CM | POA: Diagnosis not present

## 2021-12-26 DIAGNOSIS — H35373 Puckering of macula, bilateral: Secondary | ICD-10-CM | POA: Diagnosis not present

## 2021-12-26 DIAGNOSIS — H04123 Dry eye syndrome of bilateral lacrimal glands: Secondary | ICD-10-CM | POA: Diagnosis not present

## 2022-03-26 ENCOUNTER — Encounter: Payer: Self-pay | Admitting: Physician Assistant

## 2022-03-26 ENCOUNTER — Ambulatory Visit: Payer: Medicare HMO | Admitting: Physician Assistant

## 2022-03-26 DIAGNOSIS — W57XXXA Bitten or stung by nonvenomous insect and other nonvenomous arthropods, initial encounter: Secondary | ICD-10-CM | POA: Diagnosis not present

## 2022-03-26 DIAGNOSIS — L720 Epidermal cyst: Secondary | ICD-10-CM | POA: Diagnosis not present

## 2022-03-26 DIAGNOSIS — S30860A Insect bite (nonvenomous) of lower back and pelvis, initial encounter: Secondary | ICD-10-CM | POA: Diagnosis not present

## 2022-03-26 MED ORDER — DOXYCYCLINE HYCLATE 100 MG PO TABS: 100.0000 mg | ORAL_TABLET | Freq: Two times a day (BID) | ORAL | 0 refills | Status: AC

## 2022-03-26 MED ORDER — DOXYCYCLINE HYCLATE 100 MG PO TABS: 100.0000 mg | ORAL_TABLET | Freq: Two times a day (BID) | ORAL | 0 refills | Status: DC

## 2022-03-26 NOTE — Progress Notes (Signed)
   New Patient   Subjective  Danielle Daniel is a 71 y.o. female who presents for the following: New Patient (Initial Visit) (Patient here today for skin check, per patient her PCP referred her over to have her moles checked. Per patient last week she got a tick bite on her back. Per patient the area is still red she is using Neosporin, no pain. No personal history or family history of atypical moles, melanoma or non mole skin cancer. ) Per patient she has not experienced any nausea, vomiting or headaches.   The following portions of the chart were reviewed this encounter and updated as appropriate:  Tobacco  Allergies  Meds  Problems  Med Hx  Surg Hx  Fam Hx      Objective  Well appearing patient in no apparent distress; mood and affect are within normal limits.  A full examination was performed including scalp, head, eyes, ears, nose, lips, neck, chest, axillae, abdomen, back, buttocks, bilateral upper extremities, bilateral lower extremities, hands, feet, fingers, toes, fingernails, and toenails. All findings within normal limits unless otherwise noted below.  Mid Back upper Punctate bite reaction x 2 with surrounding erythema  Right Upper Eyelid White papule   Assessment & Plan  Tick bite of back, initial encounter Mid Back upper  doxycycline (VIBRA-TABS) 100 MG tablet - Mid Back upper Take 1 tablet (100 mg total) by mouth 2 (two) times daily.  Milia Right Upper Eyelid  observe     I, Nicki Gracy, PA-C, have reviewed all documentation's for this visit.  The documentation on 03/26/22 for the exam, diagnosis, procedures and orders are all accurate and complete.

## 2022-04-03 DIAGNOSIS — Z0100 Encounter for examination of eyes and vision without abnormal findings: Secondary | ICD-10-CM | POA: Diagnosis not present

## 2022-05-15 ENCOUNTER — Telehealth: Payer: Self-pay | Admitting: Family Medicine

## 2022-05-15 NOTE — Telephone Encounter (Signed)
Opened in error

## 2022-06-02 ENCOUNTER — Ambulatory Visit (INDEPENDENT_AMBULATORY_CARE_PROVIDER_SITE_OTHER): Payer: Medicare HMO

## 2022-06-02 ENCOUNTER — Telehealth: Payer: Self-pay | Admitting: Family Medicine

## 2022-06-02 VITALS — Ht 66.0 in | Wt 154.0 lb

## 2022-06-02 DIAGNOSIS — Z0000 Encounter for general adult medical examination without abnormal findings: Secondary | ICD-10-CM | POA: Diagnosis not present

## 2022-06-02 NOTE — Telephone Encounter (Signed)
Pt would like to speak with Jana Half again, she did not say what the call was regarding. She stated she was advised she could reach out to her for any questions.

## 2022-06-02 NOTE — Patient Instructions (Signed)
Danielle Daniel , Thank you for taking time to complete your Medicare Wellness Visit. I appreciate your ongoing commitment to your health goals. Please review the following plan we discussed and let me know if I can assist you in the future.   Screening recommendations/referrals: Colonoscopy: Per our conversation due 2024 Mammogram: Completed 09/06/2021-Due 09/06/2022 Bone Density: Completed 09/21/2020-Due 09/21/2022 Recommended yearly ophthalmology/optometry visit for glaucoma screening and checkup Recommended yearly dental visit for hygiene and checkup  Vaccinations: Influenza vaccine: Declined Pneumococcal vaccine: Declined Tdap vaccine: Up to date Shingles vaccine: Declined   Covid-19:Declined  Advanced directives: Please bring a copy of Living Will and/or [place] for your chart.   Conditions/risks identified: See problem list  Next appointment: Follow up in one year for your annual wellness visit    Preventive Care 65 Years and Older, Female Preventive care refers to lifestyle choices and visits with your health care provider that can promote health and wellness. What does preventive care include? A yearly physical exam. This is also called an annual well check. Dental exams once or twice a year. Routine eye exams. Ask your health care provider how often you should have your eyes checked. Personal lifestyle choices, including: Daily care of your teeth and gums. Regular physical activity. Eating a healthy diet. Avoiding tobacco and drug use. Limiting alcohol use. Practicing safe sex. Taking low-dose aspirin every day. Taking vitamin and mineral supplements as recommended by your health care provider. What happens during an annual well check? The services and screenings done by your health care provider during your annual well check will depend on your age, overall health, lifestyle risk factors, and family history of disease. Counseling  Your health care  provider may ask you questions about your: Alcohol use. Tobacco use. Drug use. Emotional well-being. Home and relationship well-being. Sexual activity. Eating habits. History of falls. Memory and ability to understand (cognition). Work and work Statistician. Reproductive health. Screening  You may have the following tests or measurements: Height, weight, and BMI. Blood pressure. Lipid and cholesterol levels. These may be checked every 5 years, or more frequently if you are over 8 years old. Skin check. Lung cancer screening. You may have this screening every year starting at age 44 if you have a 30-pack-year history of smoking and currently smoke or have quit within the past 15 years. Fecal occult blood test (FOBT) of the stool. You may have this test every year starting at age 75. Flexible sigmoidoscopy or colonoscopy. You may have a sigmoidoscopy every 5 years or a colonoscopy every 10 years starting at age 22. Hepatitis C blood test. Hepatitis B blood test. Sexually transmitted disease (STD) testing. Diabetes screening. This is done by checking your blood sugar (glucose) after you have not eaten for a while (fasting). You may have this done every 1-3 years. Bone density scan. This is done to screen for osteoporosis. You may have this done starting at age 79. Mammogram. This may be done every 1-2 years. Talk to your health care provider about how often you should have regular mammograms. Talk with your health care provider about your test results, treatment options, and if necessary, the need for more tests. Vaccines  Your health care provider may recommend certain vaccines, such as: Influenza vaccine. This is recommended every year. Tetanus, diphtheria, and acellular pertussis (Tdap, Td) vaccine. You may need a Td booster every 10 years. Zoster vaccine. You may need this after age 32. Pneumococcal 13-valent conjugate (PCV13) vaccine. One dose is recommended  after age  61. Pneumococcal polysaccharide (PPSV23) vaccine. One dose is recommended after age 62. Talk to your health care provider about which screenings and vaccines you need and how often you need them. This information is not intended to replace advice given to you by your health care provider. Make sure you discuss any questions you have with your health care provider. Document Released: 11/02/2015 Document Revised: 06/25/2016 Document Reviewed: 08/07/2015 Elsevier Interactive Patient Education  2017 [place] Prevention in the Home Falls can cause injuries. They can happen to people of all ages. There are many things you can do to make your home safe and to help prevent falls. What can I do on the outside of my home? Regularly fix the edges of walkways and driveways and fix any cracks. Remove anything that might make you trip as you walk through a door, such as a raised step or threshold. Trim any bushes or trees on the path to your home. Use bright outdoor lighting. Clear any walking paths of anything that might make someone trip, such as rocks or tools. Regularly check to see if handrails are loose or broken. Make sure that both sides of any steps have handrails. Any raised decks and porches should have guardrails on the edges. Have any leaves, snow, or ice cleared regularly. Use sand or salt on walking paths during winter. Clean up any spills in your garage right away. This includes oil or grease spills. What can I do in the bathroom? Use night lights. Install grab bars by the toilet and in the tub and shower. Do not use towel bars as grab bars. Use non-skid mats or decals in the tub or shower. If you need to sit down in the shower, use a plastic, non-slip stool. Keep the floor dry. Clean up any water that spills on the floor as soon as it happens. Remove soap buildup in the tub or shower regularly. Attach bath mats securely with double-sided non-slip rug tape. Do not have throw  rugs and other things on the floor that can make you trip. What can I do in the bedroom? Use night lights. Make sure that you have a light by your bed that is easy to reach. Do not use any sheets or blankets that are too big for your bed. They should not hang down onto the floor. Have a firm chair that has side arms. You can use this for support while you get dressed. Do not have throw rugs and other things on the floor that can make you trip. What can I do in the kitchen? Clean up any spills right away. Avoid walking on wet floors. Keep items that you use a lot in easy-to-reach places. If you need to reach something above you, use a strong step stool that has a grab bar. Keep electrical cords out of the way. Do not use floor polish or wax that makes floors slippery. If you must use wax, use non-skid floor wax. Do not have throw rugs and other things on the floor that can make you trip. What can I do with my stairs? Do not leave any items on the stairs. Make sure that there are handrails on both sides of the stairs and use them. Fix handrails that are broken or loose. Make sure that handrails are as long as the stairways. Check any carpeting to make sure that it is firmly attached to the stairs. Fix any carpet that is loose or worn. Avoid having throw  rugs at the top or bottom of the stairs. If you do have throw rugs, attach them to the floor with carpet tape. Make sure that you have a light switch at the top of the stairs and the bottom of the stairs. If you do not have them, ask someone to add them for you. What else can I do to help prevent falls? Wear shoes that: Do not have high heels. Have rubber bottoms. Are comfortable and fit you well. Are closed at the toe. Do not wear sandals. If you use a stepladder: Make sure that it is fully opened. Do not climb a closed stepladder. Make sure that both sides of the stepladder are locked into place. Ask someone to hold it for you, if  possible. Clearly mark and make sure that you can see: Any grab bars or handrails. First and last steps. Where the edge of each step is. Use tools that help you move around (mobility aids) if they are needed. These include: Canes. Walkers. Scooters. Crutches. Turn on the lights when you go into a dark area. Replace any light bulbs as soon as they burn out. Set up your furniture so you have a clear path. Avoid moving your furniture around. If any of your floors are uneven, fix them. If there are any pets around you, be aware of where they are. Review your medicines with your doctor. Some medicines can make you feel dizzy. This can increase your chance of falling. Ask your doctor what other things that you can do to help prevent falls. This information is not intended to replace advice given to you by your health care provider. Make sure you discuss any questions you have with your health care provider. Document Released: 08/02/2009 Document Revised: 03/13/2016 Document Reviewed: 11/10/2014 Elsevier Interactive Patient Education  2017 [employer].

## 2022-06-02 NOTE — Progress Notes (Addendum)
Subjective:   Danielle Daniel is a 71 y.o. female who presents for Medicare Annual (Subsequent) preventive examination.  I connected with Danielle Daniel today by telephone and verified that I am speaking with the correct person using two identifiers. Location patient: home Location provider: work Persons participating in the virtual visit: patient, Marine scientist.    I discussed the limitations, risks, security and privacy concerns of performing an evaluation and management service by telephone and the availability of in person appointments. I also discussed with the patient that there may be a patient responsible charge related to this service. The patient expressed understanding and verbally consented to this telephonic visit.    Interactive audio and video telecommunications were attempted between this provider and patient, however failed, due to patient having technical difficulties OR patient did not have access to video capability.  We continued and completed visit with audio only.  Some vital signs may be absent or patient reported.   Time Spent with patient on telephone encounter: 20 minutes   Review of Systems     Cardiac Risk Factors include: advanced age (>81mn, >>45women);dyslipidemia     Objective:    Today's Vitals   06/02/22 0900  Weight: 154 lb (69.9 kg)  Height: '5\' 6"'$  (1.676 m)   Body mass index is 24.86 kg/m.     06/02/2022    9:04 AM 05/27/2021    1:44 PM 03/04/2018    3:13 PM 08/28/2016    9:07 AM 08/13/2016    2:24 PM  Advanced Directives  Does Patient Have a Medical Advance Directive? Yes No Yes Yes Yes  Type of AParamedicof [place]Living will  [place]Living will [place]Living will [place]Living will  Does patient want to make changes to medical advance directive?    No - Patient declined   Copy of [place]in Chart? No - copy requested  No - copy requested  No - copy requested   Would patient like information on creating a medical advance directive?  Yes (MAU/Ambulatory/Procedural Areas - Information given)       Current Medications (verified) Outpatient Encounter Medications as of 06/02/2022  Medication Sig   acyclovir (ZOVIRAX) 800 MG tablet Take 800 mg TID for 2 days as needed for HSV recurrence   Ascorbic Acid (VITAMIN C) 1000 MG tablet Take 1,000 mg by mouth daily.   Coenzyme Q10 (COQ10 PO) Take 100 mg by mouth.    Garlic 15009MG CAPS    Inositol Niacinate 500 MG CAPS Take 1 capsule by mouth daily.   lisinopril (ZESTRIL) 10 MG tablet Take 1 tablet (10 mg total) by mouth daily.   Magnesium 400 MG CAPS    Omega-3 Fatty Acids (FISH OIL) 1000 MG CAPS Take by mouth.   omeprazole (PRILOSEC) 10 MG capsule Take 1 capsule (10 mg total) by mouth daily.   Red Yeast Rice 600 MG CAPS Take 2 capsules by mouth daily.    Zinc 50 MG TABS    [DISCONTINUED] Citrus Bergamot 650 MG TABS    [DISCONTINUED] COLLAGEN PO    [DISCONTINUED] QUERCETIN PO Take 500 mg by mouth daily.   [DISCONTINUED] Selenium 200 MCG CAPS Take by mouth.   No facility-administered encounter medications on file as of 06/02/2022.    Allergies (verified) Cortisone   History: Past Medical History:  Diagnosis Date   Cataract    left eye   Constipation    GERD (gastroesophageal reflux  disease)    zantac prn   HSV (herpes simplex virus) infection    Hyperlipidemia    tx red yeast rice - now normal per patient   Past Surgical History:  Procedure Laterality Date   APPENDECTOMY     CATARACT EXTRACTION Bilateral    left  07/18/14 right 04/03/20   EXPLORATORY LAPAROTOMY     RETINAL DETACHMENT SURGERY     VITRECTOMY Left    VITRECTOMY AND CATARACT Left    WISDOM TOOTH EXTRACTION     Family History  Problem Relation Age of Onset   Heart disease Mother        CHF   Heart disease Sister        arrythmia   Hypertension Sister    Heart disease Sister        arrythmia    Hypertension Sister    Colon cancer Neg Hx    Esophageal cancer Neg Hx    Rectal cancer Neg Hx    Stomach cancer Neg Hx    Breast cancer Neg Hx    Social History   Socioeconomic History   Marital status: Single    Spouse name: Not on file   Number of children: Not on file   Years of education: Not on file   Highest education level: Not on file  Occupational History   Not on file  Tobacco Use   Smoking status: Never   Smokeless tobacco: Never  Substance and Sexual Activity   Alcohol use: Yes    Comment: occasionally beer/wine   Drug use: No   Sexual activity: Not Currently    Birth control/protection: Post-menopausal  Other Topics Concern   Not on file  Social History Narrative   Not on file   Social Determinants of Health   Financial Resource Strain: Low Risk  (06/02/2022)   Overall Financial Resource Strain (CARDIA)    Difficulty of Paying Living Expenses: Not hard at all  Food Insecurity: No Food Insecurity (06/02/2022)   Hunger Vital Sign    Worried About Running Out of Food in the Last Year: Never true    Ran Out of Food in the Last Year: Never true  Transportation Needs: No Transportation Needs (06/02/2022)   PRAPARE - Hydrologist (Medical): No    Lack of Transportation (Non-Medical): No  Physical Activity: Sufficiently Active (06/02/2022)   Exercise Vital Sign    Days of Exercise per Week: 7 days    Minutes of Exercise per Session: 30 min  Stress: No Stress Concern Present (06/02/2022)   [place]    Feeling of Stress : Not at all  Social Connections: Moderately Integrated (05/27/2021)   Social Connection and Isolation Panel [NHANES]    Frequency of Communication with Friends and Family: More than three times a week    Frequency of Social Gatherings with Friends and Family: More than three times a week    Attends Religious Services: 1 to 4 times per year    Active  Member of [employer] or Organizations: Yes    Attends Archivist Meetings: 1 to 4 times per year    Marital Status: Never married    Tobacco Counseling Counseling given: Not Answered   Clinical Intake:  Pre-visit preparation completed: Yes  Pain : No/denies pain     BMI - recorded: 26.79 Nutritional Status: BMI 25 -29 Overweight Nutritional Risks: None Diabetes: No  How often do you need  to have someone help you when you read instructions, pamphlets, or other written materials from your doctor or pharmacy?: 1 - Never  Diabetic?No  Interpreter Needed?: No  Information entered by :: Caroleen Hamman LPN   Activities of Daily Living    06/02/2022    9:07 AM 06/01/2022    9:40 AM  In your present state of health, do you have any difficulty performing the following activities:  Hearing? 0 0  Vision? 0 0  Difficulty concentrating or making decisions? 0 0  Walking or climbing stairs? 0 0  Dressing or bathing? 0 0  Doing errands, shopping? 0 0  Preparing Food and eating ? N N  Using the Toilet? N N  In the past six months, have you accidently leaked urine? N N  Do you have problems with loss of bowel control? N N  Managing your Medications? N N  Managing your Finances? N N  Housekeeping or managing your Housekeeping? N N    Patient Care Team: Copland, Gay Filler, MD as PCP - General (Family Medicine) Sherlynn Stalls, MD as Consulting Physician (Ophthalmology) Armbruster, Carlota Raspberry, MD as Consulting Physician (Gastroenterology) Monna Fam, MD as Consulting Physician (Ophthalmology) Warren Danes, PA-C as Physician Assistant (Dermatology)  Indicate any recent Medical Services you may have received from other than [facility] providers in the past year (date may be approximate).     Assessment:   This is a routine wellness examination for Danielle Daniel.  Hearing/Vision screen Hearing Screening - Comments:: No issues Vision Screening - Comments:: Last eye exam-Dr.  Hecker-09/2021  Dietary issues and exercise activities discussed: Current Exercise Habits: Home exercise routine, Type of exercise: Other - see comments (yard work), Frequency (Times/Week): 7, Intensity: Mild, Exercise limited by: None identified   Goals Addressed             This Visit's Progress    Patient Stated   On track    Increase physical activity       Depression Screen    06/02/2022    9:07 AM 09/19/2021    8:22 AM 05/27/2021    1:46 PM 05/08/2021    3:28 PM 03/04/2018    3:16 PM 08/28/2016    8:37 AM 08/27/2015    8:04 AM  PHQ 2/9 Scores  PHQ - 2 Score 0 0 0 0 0 0 0    Fall Risk    06/02/2022    9:06 AM 06/01/2022    9:40 AM 09/19/2021    8:22 AM 05/27/2021    1:46 PM 05/08/2021    3:28 PM  [place] in the past year? 0 0 1 0 0  Number falls in past yr: 0 0 1 0 0  Injury with Fall? 0 0 0 0 0  Follow up Falls prevention discussed   Falls prevention discussed     FALL RISK PREVENTION PERTAINING TO THE HOME:  Any stairs in or around the home? Yes  If so, are there any without handrails? No  Home free of loose throw rugs in walkways, pet beds, electrical cords, etc? Yes  Adequate lighting in your home to reduce risk of falls? Yes   ASSISTIVE DEVICES UTILIZED TO PREVENT FALLS:  Life alert? No  Use of a cane, walker or w/c? No  Grab bars in the bathroom? No  Shower chair or bench in shower? No  Elevated toilet seat or a handicapped toilet? No   TIMED UP AND GO:  Was the test performed?  No . Phone visit   Cognitive Function:Normal cognitive status assessed by this Nurse Health Advisor. No abnormalities found.      08/28/2016    9:08 AM  MMSE - Mini Mental State Exam  Orientation to time 5  Orientation to Place 5  Registration 3  Attention/ Calculation 5  Recall 3  Language- name 2 objects 2  Language- repeat 1  Language- follow 3 step command 3  Language- read & follow direction 1  Write a sentence 1  Copy design 1  Total score 30         Immunizations Immunization History  Administered Date(s) Administered   Td 10/20/1998, 12/12/2008, 09/07/2019    TDAP status: Up to date  Flu Vaccine status: Declined, Education has been provided regarding the importance of this vaccine but patient still declined. Advised may receive this vaccine at local pharmacy or Health Dept. Aware to provide a copy of the vaccination record if obtained from local pharmacy or Health Dept. Verbalized acceptance and understanding.  Pneumococcal vaccine status: Declined,  Education has been provided regarding the importance of this vaccine but patient still declined. Advised may receive this vaccine at local pharmacy or Health Dept. Aware to provide a copy of the vaccination record if obtained from local pharmacy or Health Dept. Verbalized acceptance and understanding.   Covid-19 vaccine status: Declined, Education has been provided regarding the importance of this vaccine but patient still declined. Advised may receive this vaccine at local pharmacy or Health Dept.or vaccine clinic. Aware to provide a copy of the vaccination record if obtained from local pharmacy or Health Dept. Verbalized acceptance and understanding.  Qualifies for Shingles Vaccine? Yes   Declined   Screening Tests Health Maintenance  Topic Date Due   COVID-19 Vaccine (1) Never done   Zoster Vaccines- Shingrix (1 of 2) Never done   Pneumonia Vaccine 45+ Years old (1 - PCV) Never done   COLONOSCOPY (Pts 45-69yr Insurance coverage will need to be confirmed)  08/27/2021   INFLUENZA VACCINE  05/20/2022   MAMMOGRAM  09/06/2022   DEXA SCAN  09/21/2022   TETANUS/TDAP  09/06/2029   Hepatitis C Screening  Completed   HPV VACCINES  Aged Out    Health Maintenance  Health Maintenance Due  Topic Date Due   COVID-19 Vaccine (1) Never done   Zoster Vaccines- Shingrix (1 of 2) Never done   Pneumonia Vaccine 71 Years old (1 - PCV) Never done   COLONOSCOPY (Pts 45-448yrInsurance  coverage will need to be confirmed)  08/27/2021   INFLUENZA VACCINE  05/20/2022    Colorectal cancer screening: Type of screening: Colonoscopy. Completed 08/27/2016. Repeat every 7 per patient  years. Patient states she called Gi to schedule & they told her 7 years.  Mammogram status: Completed bilateral 09/06/2021. Repeat every year  Bone Density status: Completed 09/21/2020. Results reflect: Bone density results: NORMAL. Repeat every 2 years.  Lung Cancer Screening: (Low Dose CT Chest recommended if Age 71-80ears, 30 pack-year currently smoking OR have quit w/in 15years.) does not qualify.     Additional Screening:  Hepatitis C Screening: Completed 08/31/2017  Vision Screening: Recommended annual ophthalmology exams for early detection of glaucoma and other disorders of the eye. Is the patient up to date with their annual eye exam?  Yes  Who is the provider or what is the name of the office in which the patient attends annual eye exams? Dr. HeHerbert Deaner Dental Screening: Recommended annual dental exams for proper oral  hygiene  Community Resource Referral / Chronic Care Management: CRR required this visit?  No   CCM required this visit?  No      Plan:     I have personally reviewed and noted the following in the patient's chart:   Medical and social history Use of alcohol, tobacco or illicit drugs  Current medications and supplements including opioid prescriptions.  Functional ability and status Nutritional status Physical activity Advanced directives List of other physicians Hospitalizations, surgeries, and ER visits in previous 12 months Vitals Screenings to include cognitive, depression, and falls Referrals and appointments  In addition, I have reviewed and discussed with patient certain preventive protocols, quality metrics, and best practice recommendations. A written personalized care plan for preventive services as well as general preventive health  recommendations were provided to patient.   Due to this being a telephonic visit, the after visit summary with patients personalized plan was offered to patient via mail or my-chart. Patient would like to access on my-chart.  Marta Antu, LPN   [id number]/[id number]  Nurse Health Advisor  Nurse Notes: None

## 2022-07-24 ENCOUNTER — Other Ambulatory Visit: Payer: Self-pay | Admitting: Family Medicine

## 2022-07-24 DIAGNOSIS — Z1231 Encounter for screening mammogram for malignant neoplasm of breast: Secondary | ICD-10-CM

## 2022-08-18 ENCOUNTER — Telehealth: Payer: Medicare HMO | Admitting: Physician Assistant

## 2022-08-18 DIAGNOSIS — R0982 Postnasal drip: Secondary | ICD-10-CM | POA: Diagnosis not present

## 2022-08-18 MED ORDER — FLUTICASONE PROPIONATE 50 MCG/ACT NA SUSP: 2.0000 | Freq: Every day | NASAL | 0 refills | Status: DC

## 2022-08-18 NOTE — Progress Notes (Signed)
E visit for Allergic Rhinitis We are sorry that you are not feeling well.  Here is how we plan to help!  Based on what you have shared with me it looks like you have Allergic Rhinitis.  Rhinitis is when a reaction occurs that causes nasal congestion, runny nose, sneezing, and itching.  Most types of rhinitis are caused by an inflammation and are associated with symptoms in the eyes ears or throat. There are several types of rhinitis.  The most common are acute rhinitis, which is usually caused by a viral illness, allergic or seasonal rhinitis, and nonallergic or year-round rhinitis.  Nasal allergies occur certain times of the year.  Allergic rhinitis is caused when allergens in the air trigger the release of histamine in the body.  Histamine causes itching, swelling, and fluid to build up in the fragile linings of the nasal passages, sinuses and eyelids.  An itchy nose and clear discharge are common.  I would recommend a nasal spray: Flonase 2 sprays into each nostril once daily This has been prescribed to the pharmacy on file  HOME CARE:  You can use an over-the-counter saline nasal spray as needed Avoid areas where there is heavy dust, mites, or molds Stay indoors on windy days during the pollen season Keep windows closed in home, at least in bedroom; use air conditioner. Use high-efficiency house air filter Keep windows closed in car, turn AC on re-circulate Avoid playing out with dog during pollen season  GET HELP RIGHT AWAY IF:  If your symptoms do not improve within 10 days You become short of breath You develop yellow or green discharge from your nose for over 3 days You have coughing fits  MAKE SURE YOU:  Understand these instructions Will watch your condition Will get help right away if you are not doing well or get worse  Thank you for choosing an e-visit. Your e-visit answers were reviewed by a board certified advanced clinical practitioner to complete your personal care  plan. Depending upon the condition, your plan could have included both over the counter or prescription medications. Please review your pharmacy choice. Be sure that the pharmacy you have chosen is open so that you can pick up your prescription now.  If there is a problem you may message your provider in [place] to have the prescription routed to another pharmacy. Your safety is important to [place]. If you have drug allergies check your prescription carefully.  For the next 24 hours, you can use MyChart to ask questions about today's visit, request a non-urgent call back, or ask for a work or school excuse from your e-visit provider. You will get an email in the next two days asking about your experience. I hope that your e-visit has been valuable and will speed your recovery.    I have spent 5 minutes in review of e-visit questionnaire, review and updating patient chart, medical decision making and response to patient.   Mar Daring, PA-C

## 2022-08-19 ENCOUNTER — Telehealth: Payer: Self-pay | Admitting: Family Medicine

## 2022-08-19 DIAGNOSIS — E2839 Other primary ovarian failure: Secondary | ICD-10-CM

## 2022-08-19 NOTE — Telephone Encounter (Signed)
Ordered bone density

## 2022-09-08 ENCOUNTER — Ambulatory Visit: Payer: Medicare HMO

## 2022-09-09 ENCOUNTER — Ambulatory Visit: Payer: Medicare HMO

## 2022-09-20 NOTE — Patient Instructions (Signed)
It was good to see you today, I will be in touch with your labs  Recommend age-appropriate immunizations including flu, pneumonia, COVID, RSV, shingles  Assuming all is well please see me in 6-12 months

## 2022-09-20 NOTE — Progress Notes (Unsigned)
[place] [facility] [address] [place], [place] [address] [phone]  Date:  09/24/2022   Name:  Danielle Daniel   DOB:  1951-04-18   MRN:  [id number]  PCP:  Darreld Mclean, MD    Chief Complaint: No chief complaint on file.   History of Present Illness:  Danielle Daniel is a 71 y.o. very pleasant female patient who presents with the following:  Patient seen today for physical exam Most recent visit with myself was this past January History of markedly elevated blood pressure  Recommend flu shot Mammogram scheduled It looks like she is due for colon cancer screening Recommend pneumonia vaccine, Shingrix, COVID Most recent labs about 1 year ago Bone density can be updated-normal scan 12/21 Patient Active Problem List   Diagnosis Date Noted   Eustachian tube dysfunction 10/10/2014   Lymphadenopathy 10/10/2014   Trapezius muscle spasm 05/19/2013   VISUAL CHANGES 12/20/2010   GERD 11/07/2010   DYSPHAGIA 11/07/2010   CONSTIPATION, CHRONIC 12/18/2009   HAND SPRAIN 10/27/2007    Past Medical History:  Diagnosis Date   Cataract    left eye   Constipation    GERD (gastroesophageal reflux disease)    zantac prn   HSV (herpes simplex virus) infection    Hyperlipidemia    tx red yeast rice - now normal per patient    Past Surgical History:  Procedure Laterality Date   APPENDECTOMY     CATARACT EXTRACTION Bilateral    left  07/18/14 right 04/03/20   EXPLORATORY LAPAROTOMY     RETINAL DETACHMENT SURGERY     VITRECTOMY Left    VITRECTOMY AND CATARACT Left    WISDOM TOOTH EXTRACTION      Social History   Tobacco Use   Smoking status: Never   Smokeless tobacco: Never  Substance Use Topics   Alcohol use: Yes    Comment: occasionally beer/wine   Drug use: No    Family History  Problem Relation Age of Onset   Heart disease Mother        CHF   Heart disease Sister        arrythmia    Hypertension Sister    Heart disease Sister        arrythmia   Hypertension Sister    Colon cancer Neg Hx    Esophageal cancer Neg Hx    Rectal cancer Neg Hx    Stomach cancer Neg Hx    Breast cancer Neg Hx     Allergies  Allergen Reactions   Cortisone Rash    Medication list has been reviewed and updated.  Current Outpatient Medications on File Prior to Visit  Medication Sig Dispense Refill   acyclovir (ZOVIRAX) 800 MG tablet Take 800 mg TID for 2 days as needed for HSV recurrence 30 tablet 1   Ascorbic Acid (VITAMIN C) 1000 MG tablet Take 1,000 mg by mouth daily.     Coenzyme Q10 (COQ10 PO) Take 100 mg by mouth.      fluticasone (FLONASE) 50 MCG/ACT nasal spray Place 2 sprays into both nostrils daily. 16 g 0   Garlic 4580 MG CAPS      Inositol Niacinate 500 MG CAPS Take 1 capsule by mouth daily.     lisinopril (ZESTRIL) 10 MG tablet Take 1 tablet (10 mg total) by mouth daily. 90 tablet 3   Magnesium 400 MG CAPS      Omega-3 Fatty  Acids (FISH OIL) 1000 MG CAPS Take by mouth.     omeprazole (PRILOSEC) 10 MG capsule Take 1 capsule (10 mg total) by mouth daily. 90 capsule 3   Red Yeast Rice 600 MG CAPS Take 2 capsules by mouth daily.      Zinc 50 MG TABS      No current facility-administered medications on file prior to visit.    Review of Systems:  As per HPI- otherwise negative.   Physical Examination: There were no vitals filed for this visit. There were no vitals filed for this visit. There is no height or weight on file to calculate BMI. Ideal Body Weight:    GEN: no acute distress. HEENT: Atraumatic, Normocephalic.  Ears and Nose: No external deformity. CV: RRR, No M/G/R. No JVD. No thrill. No extra heart sounds. PULM: CTA B, no wheezes, crackles, rhonchi. No retractions. No resp. distress. No accessory muscle use. ABD: S, NT, ND, +BS. No rebound. No HSM. EXTR: No c/c/e PSYCH: Normally interactive. Conversant.    Assessment and Plan: *** Physical exam  today.  Encouraged healthy diet and exercise routine Will plan further follow- up pending labs.  Signed Lamar Blinks, MD

## 2022-09-22 DIAGNOSIS — H02831 Dermatochalasis of right upper eyelid: Secondary | ICD-10-CM | POA: Diagnosis not present

## 2022-09-22 DIAGNOSIS — H40023 Open angle with borderline findings, high risk, bilateral: Secondary | ICD-10-CM | POA: Diagnosis not present

## 2022-09-22 DIAGNOSIS — H02834 Dermatochalasis of left upper eyelid: Secondary | ICD-10-CM | POA: Diagnosis not present

## 2022-09-24 ENCOUNTER — Encounter (HOSPITAL_BASED_OUTPATIENT_CLINIC_OR_DEPARTMENT_OTHER): Payer: Self-pay | Admitting: Family Medicine

## 2022-09-24 ENCOUNTER — Encounter (HOSPITAL_BASED_OUTPATIENT_CLINIC_OR_DEPARTMENT_OTHER): Payer: Self-pay

## 2022-09-24 ENCOUNTER — Ambulatory Visit (HOSPITAL_BASED_OUTPATIENT_CLINIC_OR_DEPARTMENT_OTHER)
Admission: RE | Admit: 2022-09-24 | Discharge: 2022-09-24 | Disposition: A | Discharge status: Home / Self Care | Payer: Medicare HMO | Source: Ambulatory Visit | Attending: Family Medicine | Admitting: Family Medicine

## 2022-09-24 ENCOUNTER — Encounter: Payer: Self-pay | Admitting: Family Medicine

## 2022-09-24 ENCOUNTER — Ambulatory Visit (INDEPENDENT_AMBULATORY_CARE_PROVIDER_SITE_OTHER): Payer: Medicare HMO | Admitting: Family Medicine

## 2022-09-24 VITALS — BP 124/80 | HR 68 | Temp 97.7°F | Resp 18 | Ht 66.0 in | Wt 153.2 lb

## 2022-09-24 DIAGNOSIS — Z13220 Encounter for screening for lipoid disorders: Secondary | ICD-10-CM

## 2022-09-24 DIAGNOSIS — R5383 Other fatigue: Secondary | ICD-10-CM

## 2022-09-24 DIAGNOSIS — D539 Nutritional anemia, unspecified: Secondary | ICD-10-CM | POA: Diagnosis not present

## 2022-09-24 DIAGNOSIS — I10 Essential (primary) hypertension: Secondary | ICD-10-CM

## 2022-09-24 DIAGNOSIS — Z131 Encounter for screening for diabetes mellitus: Secondary | ICD-10-CM

## 2022-09-24 DIAGNOSIS — Z0000 Encounter for general adult medical examination without abnormal findings: Secondary | ICD-10-CM | POA: Diagnosis not present

## 2022-09-24 DIAGNOSIS — Z1231 Encounter for screening mammogram for malignant neoplasm of breast: Secondary | ICD-10-CM | POA: Diagnosis not present

## 2022-09-24 DIAGNOSIS — E785 Hyperlipidemia, unspecified: Secondary | ICD-10-CM

## 2022-09-24 LAB — LIPID PANEL
Cholesterol: 209 mg/dL — ABNORMAL HIGH (ref 0–200)
HDL: 70.3 mg/dL (ref >39.00)
LDL Cholesterol: 122 mg/dL — ABNORMAL HIGH (ref 0–99)
NonHDL: 138.81
Total CHOL/HDL Ratio: 3
Triglycerides: 85 mg/dL (ref 0.0–149.0)
VLDL: 17 mg/dL (ref 0.0–40.0)

## 2022-09-24 LAB — COMPREHENSIVE METABOLIC PANEL
ALT: 15 U/L (ref 0–35)
AST: 22 U/L (ref 0–37)
Albumin: 4.3 g/dL (ref 3.5–5.2)
Alkaline Phosphatase: 59 U/L (ref 39–117)
BUN: 12 mg/dL (ref 6–23)
CO2: 31 mEq/L (ref 19–32)
Calcium: 9.4 mg/dL (ref 8.4–10.5)
Chloride: 103 mEq/L (ref 96–112)
Creatinine, Ser: 0.89 mg/dL (ref 0.40–1.20)
GFR: 65.26 mL/min (ref >60.00)
Glucose, Bld: 76 mg/dL (ref 70–99)
Potassium: 4.6 mEq/L (ref 3.5–5.1)
Sodium: 140 mEq/L (ref 135–145)
Total Bilirubin: 0.5 mg/dL (ref 0.2–1.2)
Total Protein: 6.9 g/dL (ref 6.0–8.3)

## 2022-09-24 LAB — TSH: TSH: 3.5 u[IU]/mL (ref 0.35–5.50)

## 2022-09-24 LAB — CBC
HCT: 38.3 % (ref 36.0–46.0)
Hemoglobin: 13.2 g/dL (ref 12.0–15.0)
MCHC: 34.3 g/dL (ref 30.0–36.0)
MCV: 96.3 fl (ref 78.0–100.0)
Platelets: 358 10*3/uL (ref 150.0–400.0)
RBC: 3.98 Mil/uL (ref 3.87–5.11)
RDW: 13.5 % (ref 11.5–15.5)
WBC: 8.5 10*3/uL (ref 4.0–10.5)

## 2022-09-24 LAB — HEMOGLOBIN A1C: Hgb A1c MFr Bld: 5.4 % (ref 4.6–6.5)

## 2022-09-24 MED ORDER — LISINOPRIL 5 MG PO TABS: 5.0000 mg | ORAL_TABLET | Freq: Every day | ORAL | 3 refills | Status: DC

## 2022-09-25 ENCOUNTER — Ambulatory Visit (HOSPITAL_BASED_OUTPATIENT_CLINIC_OR_DEPARTMENT_OTHER)
Admission: RE | Admit: 2022-09-25 | Discharge: 2022-09-25 | Disposition: A | Discharge status: Home / Self Care | Payer: Medicare HMO | Source: Ambulatory Visit | Attending: Family Medicine | Admitting: Family Medicine

## 2022-09-25 ENCOUNTER — Other Ambulatory Visit (HOSPITAL_BASED_OUTPATIENT_CLINIC_OR_DEPARTMENT_OTHER): Payer: Medicare HMO

## 2022-09-25 ENCOUNTER — Encounter (HOSPITAL_BASED_OUTPATIENT_CLINIC_OR_DEPARTMENT_OTHER): Payer: Self-pay

## 2022-09-25 DIAGNOSIS — E2839 Other primary ovarian failure: Secondary | ICD-10-CM | POA: Insufficient documentation

## 2022-09-25 DIAGNOSIS — Z780 Asymptomatic menopausal state: Secondary | ICD-10-CM | POA: Diagnosis not present

## 2022-09-30 ENCOUNTER — Encounter: Payer: Self-pay | Admitting: Family Medicine

## 2022-10-17 ENCOUNTER — Encounter: Payer: Self-pay | Admitting: Family Medicine

## 2022-10-17 ENCOUNTER — Ambulatory Visit (HOSPITAL_BASED_OUTPATIENT_CLINIC_OR_DEPARTMENT_OTHER)
Admission: RE | Admit: 2022-10-17 | Discharge: 2022-10-17 | Disposition: A | Discharge status: Home / Self Care | Payer: Medicare HMO | Source: Ambulatory Visit | Attending: Family Medicine | Admitting: Family Medicine

## 2022-10-17 DIAGNOSIS — E785 Hyperlipidemia, unspecified: Secondary | ICD-10-CM | POA: Insufficient documentation

## 2022-11-21 DIAGNOSIS — H5213 Myopia, bilateral: Secondary | ICD-10-CM | POA: Diagnosis not present

## 2022-11-21 DIAGNOSIS — H52209 Unspecified astigmatism, unspecified eye: Secondary | ICD-10-CM | POA: Diagnosis not present

## 2023-01-14 ENCOUNTER — Other Ambulatory Visit: Payer: Self-pay | Admitting: Family Medicine

## 2023-01-14 DIAGNOSIS — K219 Gastro-esophageal reflux disease without esophagitis: Secondary | ICD-10-CM

## 2023-02-09 ENCOUNTER — Ambulatory Visit (INDEPENDENT_AMBULATORY_CARE_PROVIDER_SITE_OTHER): Payer: Medicare HMO | Admitting: Family Medicine

## 2023-02-09 VITALS — BP 112/72 | HR 71 | Temp 97.9°F | Resp 18 | Ht 66.0 in | Wt 153.4 lb

## 2023-02-09 DIAGNOSIS — L720 Epidermal cyst: Secondary | ICD-10-CM

## 2023-02-09 NOTE — Progress Notes (Signed)
[facility] [address] [place], [place] [address] [phone]  Date:  02/09/2023   Name:  Danielle Daniel   DOB:  12/03/1950   MRN:  [id number]  PCP:  Pearline Cables, MD    Chief Complaint: finger issue (L ring finger nodule that might be arthritis. No pain. )   History of Present Illness:  Danielle Daniel is a 72 y.o. very pleasant female patient who presents with the following:  Patient seen today with a concern about her left ring finger Most recent visit with myself was in December for routine physical History of elevated blood pressure-taking a low-dose of lisinopril  She notes a tender bump on her left ring finger- she noted it about a week ago NKI Otherwise she feels fine  She is left handed She does use her hands a lot in her garden   BP Readings from Last 3 Encounters:  02/09/23 112/72  09/24/22 124/80  11/14/21 139/76     Patient Active Problem List   Diagnosis Date Noted   Eustachian tube dysfunction 10/10/2014   Lymphadenopathy 10/10/2014   Trapezius muscle spasm 05/19/2013   VISUAL CHANGES 12/20/2010   GERD 11/07/2010   DYSPHAGIA 11/07/2010   CONSTIPATION, CHRONIC 12/18/2009   HAND SPRAIN 10/27/2007    Past Medical History:  Diagnosis Date   Cataract    left eye   Constipation    GERD (gastroesophageal reflux disease)    zantac prn   HSV (herpes simplex virus) infection    Hyperlipidemia    tx red yeast rice - now normal per patient    Past Surgical History:  Procedure Laterality Date   APPENDECTOMY     CATARACT EXTRACTION Bilateral    left  07/18/14 right 04/03/20   EXPLORATORY LAPAROTOMY     RETINAL DETACHMENT SURGERY     VITRECTOMY Left    VITRECTOMY AND CATARACT Left    WISDOM TOOTH EXTRACTION      Social History   Tobacco Use   Smoking status: Never   Smokeless tobacco: Never  Substance Use Topics   Alcohol use: Yes    Comment: occasionally beer/wine    Drug use: No    Family History  Problem Relation Age of Onset   Heart disease Mother        CHF   Heart disease Sister        arrythmia   Hypertension Sister    Heart disease Sister        arrythmia   Hypertension Sister    Colon cancer Neg Hx    Esophageal cancer Neg Hx    Rectal cancer Neg Hx    Stomach cancer Neg Hx    Breast cancer Neg Hx     Allergies  Allergen Reactions   Cortisone Rash    Medication list has been reviewed and updated.  Current Outpatient Medications on File Prior to Visit  Medication Sig Dispense Refill   acyclovir (ZOVIRAX) 800 MG tablet Take 800 mg TID for 2 days as needed for HSV recurrence 30 tablet 1   Ascorbic Acid (VITAMIN C) 1000 MG tablet Take 1,000 mg by mouth daily.     CALCIUM PO Take by mouth.     Coenzyme Q10 (COQ10 PO) Take 100 mg by mouth.      COLLAGEN PO Take by mouth.     Inositol Niacinate 500 MG CAPS Take 1 capsule by mouth daily.  lisinopril (ZESTRIL) 5 MG tablet Take 1 tablet (5 mg total) by mouth daily. 90 tablet 3   Magnesium 400 MG CAPS      Omega-3 Fatty Acids (FISH OIL) 1000 MG CAPS Take by mouth.     omeprazole (PRILOSEC) 10 MG capsule Take 1 capsule by mouth once daily 90 capsule 1   Red Yeast Rice 600 MG CAPS Take 2 capsules by mouth daily.      VITAMIN D PO Take by mouth.     Zinc 50 MG TABS      No current facility-administered medications on file prior to visit.    Review of Systems:  As per HPI- otherwise negative.   Physical Examination: Vitals:   02/09/23 1442  BP: 112/72  Pulse: 71  Resp: 18  Temp: 97.9 F (36.6 C)  SpO2: 99%   Vitals:   02/09/23 1442  Weight: 153 lb 6.4 oz (69.6 kg)  Height:  (1.676 m)   Body mass index is 24.76 kg/m. Ideal Body Weight: Weight in (lb) to have BMI = 25: 154.6  GEN: no acute distress. Normal weight, looks well  HEENT: Atraumatic, Normocephalic.  Ears and Nose: No external deformity. CV: RRR, No M/G/R. No JVD. No thrill. No extra heart  sounds. PULM: CTA B, no wheezes, crackles, rhonchi. No retractions. No resp. distress. No accessory muscle use. ABD: S, NT, ND, +BS. No rebound. No HSM. EXTR: No c/c/e PSYCH: Normally interactive. Conversant.  Approx 4mm diameter mobile round cyst on the dorsal surface of her left ringer finger overlying the middle phalanx   Assessment and Plan: Epidermoid cyst of finger - Plan: Ambulatory referral to Hand Surgery Likely mucinous cyst of finger Referral to hand surgery for definitive management   Signed Abbe Amsterdam, MD

## 2023-02-09 NOTE — Patient Instructions (Signed)
It was good to see you today- we will set you up to see a hand surgeon about the cyst on your finger

## 2023-03-24 DIAGNOSIS — H35373 Puckering of macula, bilateral: Secondary | ICD-10-CM | POA: Diagnosis not present

## 2023-03-24 DIAGNOSIS — H40023 Open angle with borderline findings, high risk, bilateral: Secondary | ICD-10-CM | POA: Diagnosis not present

## 2023-03-24 DIAGNOSIS — H43811 Vitreous degeneration, right eye: Secondary | ICD-10-CM | POA: Diagnosis not present

## 2023-03-24 DIAGNOSIS — H35033 Hypertensive retinopathy, bilateral: Secondary | ICD-10-CM | POA: Diagnosis not present

## 2023-03-30 ENCOUNTER — Telehealth: Payer: Self-pay

## 2023-03-30 DIAGNOSIS — L720 Epidermal cyst: Secondary | ICD-10-CM

## 2023-03-30 NOTE — Telephone Encounter (Signed)
Patient reports she will need a new ortho referral to [facility] due to insurance coverage.  [facility]  [address] Floor, [address], [place], [place] [address] Phone: [phone]

## 2023-04-06 DIAGNOSIS — M67442 Ganglion, left hand: Secondary | ICD-10-CM | POA: Diagnosis not present

## 2023-04-06 DIAGNOSIS — R2232 Localized swelling, mass and lump, left upper limb: Secondary | ICD-10-CM | POA: Diagnosis not present

## 2023-04-27 ENCOUNTER — Other Ambulatory Visit: Payer: Self-pay | Admitting: Pharmacist

## 2023-04-27 NOTE — Progress Notes (Signed)
Pharmacy Quality Measure Review  This patient is appearing on a report for being at risk of failing the adherence measure for hypertension (ACEi/ARB) medications this calendar year. Patient failed [facility] in 2023  Medication: lisinopril 5mg  Last fill date: 01/14/2023 for 90 day supply  Checked to see if there were any refills in Dr Tiajuana Amass / med history database but no recent refills.   Called [facility] - they filled lisinopril 5mg  for 90 DS on 04/17/2023 and was picked up same day.

## 2023-06-02 ENCOUNTER — Telehealth: Payer: Self-pay | Admitting: Family Medicine

## 2023-06-02 NOTE — Telephone Encounter (Signed)
Copied from CRM [id number]. Topic: Medicare AWV >> Jun 02, 2023 10:29 AM Payton Doughty wrote: Reason for CRM: LM 06/02/2023 to schedule AWV   Verlee Rossetti; Care Guide Ambulatory Clinical Support  l [facility] Direct Dial: [phone]

## 2023-06-11 ENCOUNTER — Ambulatory Visit (INDEPENDENT_AMBULATORY_CARE_PROVIDER_SITE_OTHER): Payer: Medicare HMO | Admitting: *Deleted

## 2023-06-11 VITALS — Ht 66.0 in | Wt 150.0 lb

## 2023-06-11 DIAGNOSIS — Z0000 Encounter for general adult medical examination without abnormal findings: Secondary | ICD-10-CM

## 2023-06-11 NOTE — Patient Instructions (Signed)
Danielle Daniel , Thank you for taking time to come for your Medicare Wellness Visit. I appreciate your ongoing commitment to your health goals. Please review the following plan we discussed and let me know if I can assist you in the future.     This is a list of the screening recommended for you and due dates:  Health Maintenance  Topic Date Due   Zoster (Shingles) Vaccine (1 of 2) Never done   Pneumonia Vaccine (1 of 1 - PCV) Never done   Colon Cancer Screening  08/27/2021   COVID-19 Vaccine (1 - 2023-24 season) Never done   Flu Shot  05/21/2023   Mammogram  09/25/2023   Medicare Annual Wellness Visit  06/10/2024   DEXA scan (bone density measurement)  09/25/2024   DTaP/Tdap/Td vaccine (4 - Tdap) 09/06/2029   Hepatitis C Screening  Completed   HPV Vaccine  Aged Out    Next appointment: Follow up in one year for your annual wellness visit.   Preventive Care 72 Years and Older, Female Preventive care refers to lifestyle choices and visits with your health care provider that can promote health and wellness. What does preventive care include? A yearly physical exam. This is also called an annual well check. Dental exams once or twice a year. Routine eye exams. Ask your health care provider how often you should have your eyes checked. Personal lifestyle choices, including: Daily care of your teeth and gums. Regular physical activity. Eating a healthy diet. Avoiding tobacco and drug use. Limiting alcohol use. Practicing safe sex. Taking low-dose aspirin every day. Taking vitamin and mineral supplements as recommended by your health care provider. What happens during an annual well check? The services and screenings done by your health care provider during your annual well check will depend on your age, overall health, lifestyle risk factors, and family history of disease. Counseling  Your health care provider may ask you questions about your: Alcohol use. Tobacco use. Drug  use. Emotional well-being. Home and relationship well-being. Sexual activity. Eating habits. History of falls. Memory and ability to understand (cognition). Work and work Astronomer. Reproductive health. Screening  You may have the following tests or measurements: Height, weight, and BMI. Blood pressure. Lipid and cholesterol levels. These may be checked every 5 years, or more frequently if you are over 49 years old. Skin check. Lung cancer screening. You may have this screening every year starting at age 72 if you have a 30-pack-year history of smoking and currently smoke or have quit within the past 15 years. Fecal occult blood test (FOBT) of the stool. You may have this test every year starting at age 60. Flexible sigmoidoscopy or colonoscopy. You may have a sigmoidoscopy every 5 years or a colonoscopy every 10 years starting at age 74. Hepatitis C blood test. Hepatitis B blood test. Sexually transmitted disease (STD) testing. Diabetes screening. This is done by checking your blood sugar (glucose) after you have not eaten for a while (fasting). You may have this done every 1-3 years. Bone density scan. This is done to screen for osteoporosis. You may have this done starting at age 49. Mammogram. This may be done every 1-2 years. Talk to your health care provider about how often you should have regular mammograms. Talk with your health care provider about your test results, treatment options, and if necessary, the need for more tests. Vaccines  Your health care provider may recommend certain vaccines, such as: Influenza vaccine. This is recommended every year. Tetanus,  diphtheria, and acellular pertussis (Tdap, Td) vaccine. You may need a Td booster every 10 years. Zoster vaccine. You may need this after age 90. Pneumococcal 13-valent conjugate (PCV13) vaccine. One dose is recommended after age 88. Pneumococcal polysaccharide (PPSV23) vaccine. One dose is recommended after age  72. Talk to your health care provider about which screenings and vaccines you need and how often you need them. This information is not intended to replace advice given to you by your health care provider. Make sure you discuss any questions you have with your health care provider. Document Released: 11/02/2015 Document Revised: 06/25/2016 Document Reviewed: 08/07/2015 Elsevier Interactive Patient Education  2017 [employer].  Fall Prevention in the Home Falls can cause injuries. They can happen to people of all ages. There are many things you can do to make your home safe and to help prevent falls. What can I do on the outside of my home? Regularly fix the edges of walkways and driveways and fix any cracks. Remove anything that might make you trip as you walk through a door, such as a raised step or threshold. Trim any bushes or trees on the path to your home. Use bright outdoor lighting. Clear any walking paths of anything that might make someone trip, such as rocks or tools. Regularly check to see if handrails are loose or broken. Make sure that both sides of any steps have handrails. Any raised decks and porches should have guardrails on the edges. Have any leaves, snow, or ice cleared regularly. Use sand or salt on walking paths during winter. Clean up any spills in your garage right away. This includes oil or grease spills. What can I do in the bathroom? Use night lights. Install grab bars by the toilet and in the tub and shower. Do not use towel bars as grab bars. Use non-skid mats or decals in the tub or shower. If you need to sit down in the shower, use a plastic, non-slip stool. Keep the floor dry. Clean up any water that spills on the floor as soon as it happens. Remove soap buildup in the tub or shower regularly. Attach bath mats securely with double-sided non-slip rug tape. Do not have throw rugs and other things on the floor that can make you trip. What can I do in the  bedroom? Use night lights. Make sure that you have a light by your bed that is easy to reach. Do not use any sheets or blankets that are too big for your bed. They should not hang down onto the floor. Have a firm chair that has side arms. You can use this for support while you get dressed. Do not have throw rugs and other things on the floor that can make you trip. What can I do in the kitchen? Clean up any spills right away. Avoid walking on wet floors. Keep items that you use a lot in easy-to-reach places. If you need to reach something above you, use a strong step stool that has a grab bar. Keep electrical cords out of the way. Do not use floor polish or wax that makes floors slippery. If you must use wax, use non-skid floor wax. Do not have throw rugs and other things on the floor that can make you trip. What can I do with my stairs? Do not leave any items on the stairs. Make sure that there are handrails on both sides of the stairs and use them. Fix handrails that are broken or loose. Make  sure that handrails are as long as the stairways. Check any carpeting to make sure that it is firmly attached to the stairs. Fix any carpet that is loose or worn. Avoid having throw rugs at the top or bottom of the stairs. If you do have throw rugs, attach them to the floor with carpet tape. Make sure that you have a light switch at the top of the stairs and the bottom of the stairs. If you do not have them, ask someone to add them for you. What else can I do to help prevent falls? Wear shoes that: Do not have high heels. Have rubber bottoms. Are comfortable and fit you well. Are closed at the toe. Do not wear sandals. If you use a stepladder: Make sure that it is fully opened. Do not climb a closed stepladder. Make sure that both sides of the stepladder are locked into place. Ask someone to hold it for you, if possible. Clearly mark and make sure that you can see: Any grab bars or  handrails. First and last steps. Where the edge of each step is. Use tools that help you move around (mobility aids) if they are needed. These include: Canes. Walkers. Scooters. Crutches. Turn on the lights when you go into a dark area. Replace any light bulbs as soon as they burn out. Set up your furniture so you have a clear path. Avoid moving your furniture around. If any of your floors are uneven, fix them. If there are any pets around you, be aware of where they are. Review your medicines with your doctor. Some medicines can make you feel dizzy. This can increase your chance of falling. Ask your doctor what other things that you can do to help prevent falls. This information is not intended to replace advice given to you by your health care provider. Make sure you discuss any questions you have with your health care provider. Document Released: 08/02/2009 Document Revised: 03/13/2016 Document Reviewed: 11/10/2014 Elsevier Interactive Patient Education  2017 [employer].

## 2023-06-11 NOTE — Progress Notes (Signed)
Subjective:   Danielle Daniel is a 72 y.o. female who presents for Medicare Annual (Subsequent) preventive examination.  Visit Complete: Virtual  I connected with  Kynnedi Der on 06/11/23 by a audio enabled telemedicine application and verified that I am speaking with the correct person using two identifiers.  Patient Location: Home  Provider Location: Office/Clinic  I discussed the limitations of evaluation and management by telemedicine. The patient expressed understanding and agreed to proceed.  Patient Medicare AWV questionnaire was completed by the patient on 06/11/23; I have confirmed that all information answered by patient is correct and no changes since this date.  Review of Systems     Cardiac Risk Factors include: advanced age (>59men, >14 women);dyslipidemia     Objective:   Pt reported vitals. Today's Vitals   06/11/23 0911  Weight: 150 lb (68 kg)  Height: 5\' 6"  (1.676 m)   Body mass index is 24.21 kg/m.     06/11/2023    9:09 AM 06/02/2022    9:04 AM 05/27/2021    1:44 PM 03/04/2018    3:13 PM 08/28/2016    9:07 AM 08/13/2016    2:24 PM  Advanced Directives  Does Patient Have a Medical Advance Directive? Yes Yes No Yes Yes Yes  Type of Advance Directive Living will Healthcare Power of [place];Living will  Healthcare Power of [place];Living will Healthcare Power of [place];Living will Healthcare Power of [place];Living will  Does patient want to make changes to medical advance directive?     No - Patient declined   Copy of Healthcare Power of Attorney in Chart?  No - copy requested  No - copy requested No - copy requested   Would patient like information on creating a medical advance directive?   Yes (MAU/Ambulatory/Procedural Areas - Information given)       Current Medications (verified) Outpatient Encounter Medications as of 06/11/2023  Medication Sig   acyclovir (ZOVIRAX) 800 MG tablet Take 800 mg TID for 2 days as needed for HSV recurrence    Ascorbic Acid (VITAMIN C) 1000 MG tablet Take 1,000 mg by mouth daily.   CALCIUM PO Take 60 mg by mouth. Vit d3   Coenzyme Q10 (COQ10 PO) Take 100 mg by mouth.    COLLAGEN PO Take by mouth.   Inositol Niacinate 500 MG CAPS Take 1 capsule by mouth daily.   lisinopril (ZESTRIL) 5 MG tablet Take 1 tablet (5 mg total) by mouth daily.   Magnesium 400 MG CAPS    Multiple Vitamins-Minerals (MULTIVITAMIN ADULTS 50+ PO) Take by mouth.   Omega-3 Fatty Acids (FISH OIL) 1000 MG CAPS Take by mouth.   omeprazole (PRILOSEC) 10 MG capsule Take 1 capsule by mouth once daily   QUERCETIN PO Take 800 mg by mouth.   Red Yeast Rice 600 MG CAPS Take 2 capsules by mouth daily.    VITAMIN D PO Take 50 mcg by mouth.   [DISCONTINUED] Zinc 50 MG TABS    No facility-administered encounter medications on file as of 06/11/2023.    Allergies (verified) Cortisone   History: Past Medical History:  Diagnosis Date   Allergy 2007   Cordisone Shot   Cataract    left eye   Constipation    GERD (gastroesophageal reflux disease)    zantac prn   HSV (herpes simplex virus) infection    Hyperlipidemia    tx red yeast rice - now normal per patient   Past Surgical History:  Procedure Laterality Date  APPENDECTOMY     CATARACT EXTRACTION Bilateral    left  07/18/14 right 04/03/20   EXPLORATORY LAPAROTOMY     EYE SURGERY  2016/2021   Detached Retina and Cataract   RETINAL DETACHMENT SURGERY     VITRECTOMY Left    VITRECTOMY AND CATARACT Left    WISDOM TOOTH EXTRACTION     Family History  Problem Relation Age of Onset   Heart disease Mother        CHF   Heart disease Sister        arrythmia   Hypertension Sister    Heart disease Sister        arrythmia   Hypertension Sister    Colon cancer Neg Hx    Esophageal cancer Neg Hx    Rectal cancer Neg Hx    Stomach cancer Neg Hx    Breast cancer Neg Hx    Social History   Socioeconomic History   Marital status: Single    Spouse name: Not on file    Number of children: Not on file   Years of education: Not on file   Highest education level: Master's degree (e.g., MA, MS, MEng, MEd, MSW, MBA)  Occupational History   Not on file  Tobacco Use   Smoking status: Never   Smokeless tobacco: Never  Substance and Sexual Activity   Alcohol use: Yes    Alcohol/week: 1.0 standard drink of alcohol    Types: 1 Glasses of wine per week    Comment: Very little. Beer or wine rarely.   Drug use: No   Sexual activity: Not Currently    Birth control/protection: Post-menopausal  Other Topics Concern   Not on file  Social History Narrative   Not on file   Social Determinants of Health   Financial Resource Strain: Low Risk  (06/11/2023)   Overall Financial Resource Strain (CARDIA)    Difficulty of Paying Living Expenses: Not hard at all  Food Insecurity: No Food Insecurity (06/11/2023)   Hunger Vital Sign    Worried About Running Out of Food in the Last Year: Never true    Ran Out of Food in the Last Year: Never true  Transportation Needs: No Transportation Needs (06/11/2023)   PRAPARE - Transportation    Lack of Transportation (Medical): No    Lack of Transportation (Non-Medical): No  Physical Activity: Insufficiently Active (06/11/2023)   Exercise Vital Sign    Days of Exercise per Week: 2 days    Minutes of Exercise per Session: 30 min  Stress: No Stress Concern Present (06/11/2023)   [employer] of Occupational Health - Occupational Stress Questionnaire    Feeling of Stress : Not at all  Social Connections: Moderately Integrated (06/11/2023)   Social Connection and Isolation Panel [NHANES]    Frequency of Communication with Friends and Family: More than three times a week    Frequency of Social Gatherings with Friends and Family: Three times a week    Attends Religious Services: More than 4 times per year    Active Member of Clubs or Organizations: Yes    Attends Engineer, structural: More than 4 times per year    Marital  Status: Divorced    Tobacco Counseling Counseling given: Not Answered   Clinical Intake:  Pre-visit preparation completed: Yes  Pain : No/denies pain  BMI - recorded: 24.21 Nutritional Status: BMI of 19-24  Normal Nutritional Risks: None Diabetes: No  How often do you need to have someone help  you when you read instructions, pamphlets, or other written materials from your doctor or pharmacy?: 1 - Never  Interpreter Needed?: No  Information entered by :: Donne Anon, CMA   Activities of Daily Living    06/11/2023    8:31 AM  In your present state of health, do you have any difficulty performing the following activities:  Hearing? 0  Vision? 0  Difficulty concentrating or making decisions? 0  Walking or climbing stairs? 0  Dressing or bathing? 0  Doing errands, shopping? 0  Preparing Food and eating ? N  Using the Toilet? N  In the past six months, have you accidently leaked urine? N  Do you have problems with loss of bowel control? N  Managing your Medications? N  Managing your Finances? N  Housekeeping or managing your Housekeeping? N    Patient Care Team: Copland, Gwenlyn Found, MD as PCP - General (Family Medicine) Stephannie Li, MD as Consulting Physician (Ophthalmology) Armbruster, Willaim Rayas, MD as Consulting Physician (Gastroenterology) Mateo Flow, MD as Consulting Physician (Ophthalmology) Glyn Ade, PA-C as Physician Assistant (Dermatology)  Indicate any recent Medical Services you may have received from other than [facility] providers in the past year (date may be approximate).     Assessment:   This is a routine wellness examination for Khiabet.  Hearing/Vision screen No results found.  Dietary issues and exercise activities discussed:     Goals Addressed   None    Depression Screen    06/11/2023    9:10 AM 09/24/2022    8:14 AM 06/02/2022    9:07 AM 09/19/2021    8:22 AM 05/27/2021    1:46 PM 05/08/2021    3:28 PM 03/04/2018    3:16 PM   PHQ 2/9 Scores  PHQ - 2 Score 0 0 0 0 0 0 0    Fall Risk    06/11/2023    8:31 AM 09/24/2022    8:14 AM 06/02/2022    9:06 AM 06/01/2022    9:40 AM 09/19/2021    8:22 AM  Fall Risk   Falls in the past year? 0 0 0 0 1  Number falls in past yr: 0 0 0 0 1  Injury with Fall? 0 0 0 0 0  Risk for fall due to : No Fall Risks No Fall Risks     Follow up Falls evaluation completed Falls evaluation completed Falls prevention discussed      MEDICARE RISK AT HOME: Medicare Risk at Home Any stairs in or around the home?: Yes If so, are there any without handrails?: No Home free of loose throw rugs in walkways, pet beds, electrical cords, etc?: Yes Adequate lighting in your home to reduce risk of falls?: Yes Life alert?: No Use of a cane, walker or w/c?: No Grab bars in the bathroom?: No Shower chair or bench in shower?: No Elevated toilet seat or a handicapped toilet?: No  TIMED UP AND GO:  Was the test performed?  No    Cognitive Function:    08/28/2016    9:08 AM  MMSE - Mini Mental State Exam  Orientation to time 5  Orientation to Place 5  Registration 3  Attention/ Calculation 5  Recall 3  Language- name 2 objects 2  Language- repeat 1  Language- follow 3 step command 3  Language- read & follow direction 1  Write a sentence 1  Copy design 1  Total score 30        06/11/2023  9:12 AM  6CIT Screen  What Year? 0 points  What month? 0 points  What time? 0 points  Count back from 20 0 points  Months in reverse 0 points  Repeat phrase 0 points  Total Score 0 points    Immunizations Immunization History  Administered Date(s) Administered   Td 10/20/1998, 12/12/2008, 09/07/2019    TDAP status: Up to date  Flu Vaccine status: Due, Education has been provided regarding the importance of this vaccine. Advised may receive this vaccine at local pharmacy or Health Dept. Aware to provide a copy of the vaccination record if obtained from local pharmacy or Health Dept.  Verbalized acceptance and understanding.  Pneumococcal vaccine status: Due, Education has been provided regarding the importance of this vaccine. Advised may receive this vaccine at local pharmacy or Health Dept. Aware to provide a copy of the vaccination record if obtained from local pharmacy or Health Dept. Verbalized acceptance and understanding.  Covid-19 vaccine status: Declined, Education has been provided regarding the importance of this vaccine but patient still declined. Advised may receive this vaccine at local pharmacy or Health Dept.or vaccine clinic. Aware to provide a copy of the vaccination record if obtained from local pharmacy or Health Dept. Verbalized acceptance and understanding.  Qualifies for Shingles Vaccine? Yes   Zostavax completed No   Shingrix Completed?: No.    Education has been provided regarding the importance of this vaccine. Patient has been advised to call insurance company to determine out of pocket expense if they have not yet received this vaccine. Advised may also receive vaccine at local pharmacy or Health Dept. Verbalized acceptance and understanding.  Screening Tests Health Maintenance  Topic Date Due   Zoster Vaccines- Shingrix (1 of 2) Never done   Pneumonia Vaccine 18+ Years old (1 of 1 - PCV) Never done   Colonoscopy  08/27/2021   COVID-19 Vaccine (1 - 2023-24 season) Never done   Medicare Annual Wellness (AWV)  06/03/2023   INFLUENZA VACCINE  05/21/2023   MAMMOGRAM  09/25/2023   DEXA SCAN  09/25/2024   DTaP/Tdap/Td (4 - Tdap) 09/06/2029   Hepatitis C Screening  Completed   HPV VACCINES  Aged Out    Health Maintenance  Health Maintenance Due  Topic Date Due   Zoster Vaccines- Shingrix (1 of 2) Never done   Pneumonia Vaccine 65+ Years old (1 of 1 - PCV) Never done   Colonoscopy  08/27/2021   COVID-19 Vaccine (1 - 2023-24 season) Never done   Medicare Annual Wellness (AWV)  06/03/2023   INFLUENZA VACCINE  05/21/2023    Colorectal cancer  screening: Type of screening: Colonoscopy. Completed 08/27/16. Repeat every 5 years  Mammogram status: Completed 09/24/22. Repeat every year  Bone Density status: Completed 09/25/22. Results reflect: Bone density results: NORMAL. Repeat every 2 years.  Lung Cancer Screening: (Low Dose CT Chest recommended if Age 70-80 years, 20 pack-year currently smoking OR have quit w/in 15years.) does not qualify.   Additional Screening:  Hepatitis C Screening: does qualify; Completed 08/31/17  Vision Screening: Recommended annual ophthalmology exams for early detection of glaucoma and other disorders of the eye. Is the patient up to date with their annual eye exam?  Yes  Who is the provider or what is the name of the office in which the patient attends annual eye exams? Dr. Elmer Picker If pt is not established with a provider, would they like to be referred to a provider to establish care? No .   Dental Screening: Recommended  annual dental exams for proper oral hygiene  Diabetic Foot Exam: N/a  Community Resource Referral / Chronic Care Management: CRR required this visit?  No   CCM required this visit?  No     Plan:     I have personally reviewed and noted the following in the patient's chart:   Medical and social history Use of alcohol, tobacco or illicit drugs  Current medications and supplements including opioid prescriptions. Patient is not currently taking opioid prescriptions. Functional ability and status Nutritional status Physical activity Advanced directives List of other physicians Hospitalizations, surgeries, and ER visits in previous 12 months Vitals Screenings to include cognitive, depression, and falls Referrals and appointments  In addition, I have reviewed and discussed with patient certain preventive protocols, quality metrics, and best practice recommendations. A written personalized care plan for preventive services as well as general preventive health recommendations  were provided to patient.     Donne Anon, CMA   06/11/2023   After Visit Summary: (MyChart) Due to this being a telephonic visit, the after visit summary with patients personalized plan was offered to patient via MyChart   Nurse Notes: None

## 2023-07-13 ENCOUNTER — Other Ambulatory Visit: Payer: Self-pay | Admitting: Family Medicine

## 2023-07-13 DIAGNOSIS — K219 Gastro-esophageal reflux disease without esophagitis: Secondary | ICD-10-CM

## 2023-08-24 ENCOUNTER — Other Ambulatory Visit (HOSPITAL_BASED_OUTPATIENT_CLINIC_OR_DEPARTMENT_OTHER): Payer: Self-pay | Admitting: Family Medicine

## 2023-08-24 DIAGNOSIS — Z1231 Encounter for screening mammogram for malignant neoplasm of breast: Secondary | ICD-10-CM

## 2023-08-28 ENCOUNTER — Encounter: Payer: Self-pay | Admitting: Gastroenterology

## 2023-09-25 NOTE — Progress Notes (Unsigned)
[facility] [address] [place], [place] [address] [phone]  Date:  09/30/2023   Name:  Danielle Daniel   DOB:  [birth year]   MRN:  [id number]  PCP:  Pearline Cables, MD    Chief Complaint: No chief complaint on file.   History of Present Illness:  Danielle Daniel is a 72 y.o. very pleasant female patient who presents with the following:  Pt seen today for a CPE Last seen by myself in April  History of GERD, mild hypertension  Colon cancer screening Mammo Dexa Pneumonia vaccine Shingrix Flu Covid Can update labs today    Patient Active Problem List   Diagnosis Date Noted   Eustachian tube dysfunction 10/10/2014   Lymphadenopathy 10/10/2014   Trapezius muscle spasm 05/19/2013   Subjective visual disturbance 12/20/2010   GERD 11/07/2010   Dysphagia 11/07/2010   CONSTIPATION, CHRONIC 12/18/2009   Sprain of hand 10/27/2007    Past Medical History:  Diagnosis Date   Allergy 2007   Cordisone Shot   Cataract    left eye   Constipation    GERD (gastroesophageal reflux disease)    zantac prn   HSV (herpes simplex virus) infection    Hyperlipidemia    tx red yeast rice - now normal per patient    Past Surgical History:  Procedure Laterality Date   APPENDECTOMY     CATARACT EXTRACTION Bilateral    left  07/18/14 right 04/03/20   EXPLORATORY LAPAROTOMY     EYE SURGERY  2016/2021   Detached Retina and Cataract   RETINAL DETACHMENT SURGERY     VITRECTOMY Left    VITRECTOMY AND CATARACT Left    WISDOM TOOTH EXTRACTION      Social History   Tobacco Use   Smoking status: Never   Smokeless tobacco: Never  Substance Use Topics   Alcohol use: Yes    Alcohol/week: 1.0 standard drink of alcohol    Types: 1 Glasses of wine per week    Comment: Very little. Beer or wine rarely.   Drug use: No    Family History  Problem Relation Age of Onset   Heart disease Mother        CHF   Heart  disease Sister        arrythmia   Hypertension Sister    Heart disease Sister        arrythmia   Hypertension Sister    Colon cancer Neg Hx    Esophageal cancer Neg Hx    Rectal cancer Neg Hx    Stomach cancer Neg Hx    Breast cancer Neg Hx     Allergies  Allergen Reactions   Cortisone Rash    Medication list has been reviewed and updated.  Current Outpatient Medications on File Prior to Visit  Medication Sig Dispense Refill   acyclovir (ZOVIRAX) 800 MG tablet Take 800 mg TID for 2 days as needed for HSV recurrence 30 tablet 1   Ascorbic Acid (VITAMIN C) 1000 MG tablet Take 1,000 mg by mouth daily.     CALCIUM PO Take 60 mg by mouth. Vit d3     Coenzyme Q10 (COQ10 PO) Take 100 mg by mouth.      COLLAGEN PO Take by mouth.     Inositol Niacinate 500 MG CAPS Take 1 capsule by mouth daily.     lisinopril (ZESTRIL) 5 MG tablet Take 1 tablet (5  mg total) by mouth daily. 90 tablet 3   Magnesium 400 MG CAPS      Multiple Vitamins-Minerals (MULTIVITAMIN ADULTS 50+ PO) Take by mouth.     Omega-3 Fatty Acids (FISH OIL) 1000 MG CAPS Take by mouth.     omeprazole (PRILOSEC) 10 MG capsule Take 1 capsule (10 mg total) by mouth daily. 90 capsule 1   QUERCETIN PO Take 800 mg by mouth.     Red Yeast Rice 600 MG CAPS Take 2 capsules by mouth daily.      VITAMIN D PO Take 50 mcg by mouth.     No current facility-administered medications on file prior to visit.    Review of Systems:  As per HPI- otherwise negative.   Physical Examination: There were no vitals filed for this visit. There were no vitals filed for this visit. There is no height or weight on file to calculate BMI. Ideal Body Weight:    GEN: no acute distress. HEENT: Atraumatic, Normocephalic.  Ears and Nose: No external deformity. CV: RRR, No M/G/R. No JVD. No thrill. No extra heart sounds. PULM: CTA B, no wheezes, crackles, rhonchi. No retractions. No resp. distress. No accessory muscle use. ABD: S, NT, ND, +BS.  No rebound. No HSM. EXTR: No c/c/e PSYCH: Normally interactive. Conversant.    Assessment and Plan: *** Physical exam today- encouraged healthy diet and exercise routine Will plan further follow- up pending labs.  Signed Abbe Amsterdam, MD

## 2023-09-25 NOTE — Patient Instructions (Incomplete)
Good to see you again today- I will be in touch with your labs asap Good luck with your colonoscopy!   I do recommend flu, covid, pneumonia and shingles vaccination  Assuming all is well please see me in about 6 months

## 2023-09-30 ENCOUNTER — Encounter: Payer: Self-pay | Admitting: Family Medicine

## 2023-09-30 ENCOUNTER — Ambulatory Visit (INDEPENDENT_AMBULATORY_CARE_PROVIDER_SITE_OTHER): Payer: Medicare HMO | Admitting: Family Medicine

## 2023-09-30 VITALS — BP 122/80 | HR 68 | Temp 97.8°F | Resp 18 | Ht 66.0 in | Wt 151.2 lb

## 2023-09-30 DIAGNOSIS — R5383 Other fatigue: Secondary | ICD-10-CM | POA: Diagnosis not present

## 2023-09-30 DIAGNOSIS — D539 Nutritional anemia, unspecified: Secondary | ICD-10-CM

## 2023-09-30 DIAGNOSIS — E785 Hyperlipidemia, unspecified: Secondary | ICD-10-CM

## 2023-09-30 DIAGNOSIS — I10 Essential (primary) hypertension: Secondary | ICD-10-CM | POA: Diagnosis not present

## 2023-09-30 DIAGNOSIS — E2839 Other primary ovarian failure: Secondary | ICD-10-CM

## 2023-09-30 DIAGNOSIS — Z0000 Encounter for general adult medical examination without abnormal findings: Secondary | ICD-10-CM | POA: Diagnosis not present

## 2023-09-30 DIAGNOSIS — Z131 Encounter for screening for diabetes mellitus: Secondary | ICD-10-CM

## 2023-09-30 LAB — LIPID PANEL
Cholesterol: 209 mg/dL — ABNORMAL HIGH (ref 0–200)
HDL: 74.6 mg/dL (ref >39.00)
LDL Cholesterol: 120 mg/dL — ABNORMAL HIGH (ref 0–99)
NonHDL: 134
Total CHOL/HDL Ratio: 3
Triglycerides: 68 mg/dL (ref 0.0–149.0)
VLDL: 13.6 mg/dL (ref 0.0–40.0)

## 2023-09-30 LAB — COMPREHENSIVE METABOLIC PANEL
ALT: 15 U/L (ref 0–35)
AST: 24 U/L (ref 0–37)
Albumin: 4.4 g/dL (ref 3.5–5.2)
Alkaline Phosphatase: 63 U/L (ref 39–117)
BUN: 14 mg/dL (ref 6–23)
CO2: 32 meq/L (ref 19–32)
Calcium: 9.3 mg/dL (ref 8.4–10.5)
Chloride: 102 meq/L (ref 96–112)
Creatinine, Ser: 0.9 mg/dL (ref 0.40–1.20)
GFR: 63.93 mL/min (ref >60.00)
Glucose, Bld: 80 mg/dL (ref 70–99)
Potassium: 4.8 meq/L (ref 3.5–5.1)
Sodium: 140 meq/L (ref 135–145)
Total Bilirubin: 0.6 mg/dL (ref 0.2–1.2)
Total Protein: 6.9 g/dL (ref 6.0–8.3)

## 2023-09-30 LAB — HEMOGLOBIN A1C: Hgb A1c MFr Bld: 5.3 % (ref 4.6–6.5)

## 2023-09-30 LAB — CBC
HCT: 43.4 % (ref 36.0–46.0)
Hemoglobin: 14.2 g/dL (ref 12.0–15.0)
MCHC: 32.7 g/dL (ref 30.0–36.0)
MCV: 99.7 fL (ref 78.0–100.0)
Platelets: 301 10*3/uL (ref 150.0–400.0)
RBC: 4.35 Mil/uL (ref 3.87–5.11)
RDW: 13 % (ref 11.5–15.5)
WBC: 5.8 10*3/uL (ref 4.0–10.5)

## 2023-09-30 LAB — TSH: TSH: 4.28 u[IU]/mL (ref 0.35–5.50)

## 2023-09-30 MED ORDER — LISINOPRIL 5 MG PO TABS: 5.0000 mg | ORAL_TABLET | Freq: Every day | ORAL | 3 refills | Status: DC

## 2023-10-01 ENCOUNTER — Encounter (HOSPITAL_BASED_OUTPATIENT_CLINIC_OR_DEPARTMENT_OTHER): Payer: Self-pay

## 2023-10-01 ENCOUNTER — Ambulatory Visit (HOSPITAL_BASED_OUTPATIENT_CLINIC_OR_DEPARTMENT_OTHER)
Admission: RE | Admit: 2023-10-01 | Discharge: 2023-10-01 | Disposition: A | Discharge status: Home / Self Care | Payer: Medicare HMO | Source: Ambulatory Visit | Attending: Family Medicine | Admitting: Family Medicine

## 2023-10-01 DIAGNOSIS — Z1231 Encounter for screening mammogram for malignant neoplasm of breast: Secondary | ICD-10-CM | POA: Diagnosis not present

## 2023-12-22 ENCOUNTER — Encounter: Payer: Self-pay | Admitting: Gastroenterology

## 2023-12-22 ENCOUNTER — Ambulatory Visit: Payer: Self-pay | Admitting: Family Medicine

## 2023-12-22 ENCOUNTER — Ambulatory Visit: Payer: Medicare HMO | Admitting: Gastroenterology

## 2023-12-22 VITALS — BP 170/90 | HR 88 | Ht 66.0 in | Wt 155.0 lb

## 2023-12-22 DIAGNOSIS — Z860100 Personal history of colon polyps, unspecified: Secondary | ICD-10-CM

## 2023-12-22 DIAGNOSIS — Z79899 Other long term (current) drug therapy: Secondary | ICD-10-CM

## 2023-12-22 DIAGNOSIS — Z860101 Personal history of adenomatous and serrated colon polyps: Secondary | ICD-10-CM | POA: Diagnosis not present

## 2023-12-22 DIAGNOSIS — K449 Diaphragmatic hernia without obstruction or gangrene: Secondary | ICD-10-CM

## 2023-12-22 DIAGNOSIS — K219 Gastro-esophageal reflux disease without esophagitis: Secondary | ICD-10-CM | POA: Diagnosis not present

## 2023-12-22 MED ORDER — SUFLAVE 178.7 G PO SOLR: 1.0000 | Freq: Once | ORAL | 0 refills | Status: AC

## 2023-12-22 NOTE — Telephone Encounter (Signed)
 Chief Complaint: High BP reading in GI office this morning 140/100 Symptoms: None other than stress due to needing to have colonoscopy Frequency: Onset high BP today Pertinent Negatives: Patient denies other symptoms Disposition: [] ED /[] Urgent Care (no appt availability in office) / [] Appointment(In office/virtual)/ []  [facility]/ [] Home Care/ [] Refused Recommended Disposition /[] [facility]/ []  Follow-up with PCP Additional Notes: Patient says that she was in the GI office and she felt stressed knowing she needs to have a colonoscopy. She says her BP was really high and it took different nurses to check it at different times to get a reading, which also stressed her out. She says the nurse finally said it was 140/100. She says she had taken lisinopril 5 mg this morning. She says she had to go to a work meeting and planned to take her BP when she got home. She says she's on the way home now and will be there in about 30 minutes and will check her BP. She wants to know if if's ok to take another lisinopril if the BP is still up.  I advised I will call her back in 1 hour to see what the BP is at that time then we will discuss the plans regarding the BP. She verbalized understanding.    Copied From CRM [id number]. Reason for Triage: Blood pressure reading of 140/100 that was taken at patient's gastro appointment from this morning. Patient stated she is not experiencing other symptoms besides increased stress levels. Patient stated she will be in a work meeting for the next hour.   Answer Assessment - Initial Assessment Questions 1. BLOOD PRESSURE: "What is the blood pressure?" "Did you take at least two measurements 5 minutes apart?"     140/100 in the GI office 2. ONSET: "When did you take your blood pressure?"     Today  3. HOW: "How did you take your blood pressure?" (e.g., automatic home BP monitor, visiting nurse)     Office BP monitor 4. HISTORY: "Do you have a history of  high blood pressure?"     Yes 5. MEDICINES: "Are you taking any medicines for blood pressure?" "Have you missed any doses recently?"     Lisinopril 5 mg 6. OTHER SYMPTOMS: "Do you have any symptoms?" (e.g., blurred vision, chest pain, difficulty breathing, headache, weakness)     Stress this week related to needing to have a colonoscopy  Protocols used: Blood Pressure - High-A-AH

## 2023-12-22 NOTE — Progress Notes (Unsigned)
 [facility] [address], [address] [place], [place] [address] [phone]  Date:  12/24/2023   Name:  Danielle Daniel   DOB:  1951/03/01   MRN:  [id number]  PCP:  Pearline Cables, MD    Chief Complaint: No chief complaint on file.   History of Present Illness:  Danielle Daniel is a 73 y.o. very pleasant female patient who presents with the following:  Pt seen today with concern of elevated BP- History of GERD, mild hypertension  Last seen by myself in December for her CPE  She saw her GI doc quite recently at which time her BP was unexpectedly elevated  BP Readings from Last 3 Encounters:  12/22/23 (!) 170/90  09/30/23 122/80  02/09/23 112/72   She is taking lisinopril 5 mg currently   Patient Active Problem List   Diagnosis Date Noted   Eustachian tube dysfunction 10/10/2014   Lymphadenopathy 10/10/2014   Trapezius muscle spasm 05/19/2013   Subjective visual disturbance 12/20/2010   GERD 11/07/2010   Dysphagia 11/07/2010   CONSTIPATION, CHRONIC 12/18/2009   Sprain of hand 10/27/2007    Past Medical History:  Diagnosis Date   Allergy 2007   Cordisone Shot   Cataract    left eye   Constipation    GERD (gastroesophageal reflux disease)    zantac prn   HSV (herpes simplex virus) infection    Hyperlipidemia    tx red yeast rice - now normal per patient    Past Surgical History:  Procedure Laterality Date   APPENDECTOMY     CATARACT EXTRACTION Bilateral    left  07/18/14 right 04/03/20   EXPLORATORY LAPAROTOMY     EYE SURGERY  2016/2021   Detached Retina and Cataract   RETINAL DETACHMENT SURGERY     VITRECTOMY Left    VITRECTOMY AND CATARACT Left    WISDOM TOOTH EXTRACTION      Social History   Tobacco Use   Smoking status: Never   Smokeless tobacco: Never  Vaping Use   Vaping status: Never Used  Substance Use Topics   Alcohol use: Yes    Alcohol/week: 1.0 standard drink of alcohol     Types: 1 Glasses of wine per week    Comment: Very little. Beer or wine rarely.   Drug use: No    Family History  Problem Relation Age of Onset   Heart disease Mother        CHF   Heart disease Sister        arrythmia   Hypertension Sister    Heart disease Sister        arrythmia   Hypertension Sister    Colon cancer Neg Hx    Esophageal cancer Neg Hx    Liver disease Neg Hx     Allergies  Allergen Reactions   Cortisone Rash    Medication list has been reviewed and updated.  Current Outpatient Medications on File Prior to Visit  Medication Sig Dispense Refill   acyclovir (ZOVIRAX) 800 MG tablet Take 800 mg TID for 2 days as needed for HSV recurrence 30 tablet 1   Ascorbic Acid (VITAMIN C) 1000 MG tablet Take 1,000 mg by mouth daily.     CALCIUM PO Take 60 mg by mouth. Vit d3     Coenzyme Q10 (COQ10 PO) Take 100 mg by mouth.      COLLAGEN PO Take by mouth.  Inositol Niacinate 500 MG CAPS Take 1 capsule by mouth daily.     lisinopril (ZESTRIL) 5 MG tablet Take 1 tablet (5 mg total) by mouth daily. 90 tablet 3   Magnesium 400 MG CAPS daily.     Multiple Vitamins-Minerals (MULTIVITAMIN ADULTS 50+ PO) Take by mouth.     Omega-3 Fatty Acids (FISH OIL) 1000 MG CAPS Take by mouth.     omeprazole (PRILOSEC) 10 MG capsule Take 1 capsule (10 mg total) by mouth daily. 90 capsule 1   QUERCETIN PO Take 800 mg by mouth.     Red Yeast Rice 600 MG CAPS Take 2 capsules by mouth daily.      SUFLAVE 178.7 g SOLR Take 1 kit by mouth once for 1 dose. 1 each 0   VITAMIN D PO Take 50 mcg by mouth.     No current facility-administered medications on file prior to visit.    Review of Systems:  As per HPI- otherwise negative.   Physical Examination: There were no vitals filed for this visit. There were no vitals filed for this visit. There is no height or weight on file to calculate BMI. Ideal Body Weight:    GEN: no acute distress. HEENT: Atraumatic, Normocephalic.  Ears  and Nose: No external deformity. CV: RRR, No M/G/R. No JVD. No thrill. No extra heart sounds. PULM: CTA B, no wheezes, crackles, rhonchi. No retractions. No resp. distress. No accessory muscle use. ABD: S, NT, ND, +BS. No rebound. No HSM. EXTR: No c/c/e PSYCH: Normally interactive. Conversant.    Assessment and Plan: ***  Signed Abbe Amsterdam, MD

## 2023-12-22 NOTE — Progress Notes (Signed)
 HPI :  73 year old female with a history of colon polyps, GERD, hiatal hernia, here to reestablish her GI care for consideration of surveillance colonoscopy and to discuss GERD.  I last saw her in November 2017, primary care is Abbe Amsterdam MD.  She had a colonoscopy with me in November 2017.  2 small adenomas were removed at that time, recommended repeat colonoscopy in 7 years.  She reminds me that she had retained air when she woke up from the exam and was very uncomfortable.  We had to return her to the exam room and remove area endoscopically.  Once that was done she felt much better.  Her colon was quite tortuous with looping.  She reminds me that in 2007 she also had a colonoscopy with Dr. Rodena Piety, she states she had significant retained air and abdominal pain with that procedure and also had vomiting, states that experience was worse although she was very uncomfortable after her last exam.  She is rather anxious about having recurrence of retained air again and inquires about options.  She questions if she is a candidate for Cologuard.  She denies any problems with her bowels.  She does not have any blood in her stools.  She has no family history of colon cancer.  In addition we discussed her history of reflux.  She has been on omeprazole for a very long time for baseline reflux symptoms.  She underwent a cardiac CT with her primary care which incidentally showed she had a moderate size hiatal hernia.  She states over time she has been able to wean down her omeprazole to 10 mg daily and uses Tums as needed at night for nocturnal symptoms.  She denies any history of CKD, she has no history of osteoporosis or osteopenia.  No history of tobacco use.  We discussed if she wanted to pursue a screening endoscopy for Barrett's.  She is never had an EGD before   Colonoscopy 08/27/2016: - The perianal and digital rectal examinations were normal. - A 6 mm polyp was found in the ascending colon.  The polyp was sessile. The polyp was removed with a cold snare. Resection and retrieval were complete. - A 5 mm polyp was found in the hepatic flexure. The polyp was sessile. The polyp was removed with a cold snare. Resection and retrieval were complete. - The colon was tortuous with significant looping. - Anal papilla(e) were hypertrophied. - The exam was otherwise without abnormality.   ADDENDUM: After the procedure was completed the patient was not able to pass any air, had a distended abdomen and remained unfortable. We brought her back to the procedure room, placed the endoscope and reached the cecum. There was a very angulated turn at the hepatic flexure with retained air in the right colon. The entire colon was suctioned of air and abdominal distension resolved,  Surgical [P], hepatic flexure and ascending - TUBULAR ADENOMA (X3 FRAGMENTS). - NO HIGH GRADE DYSPLASIA OR MALIGNANCY.      Past Medical History:  Diagnosis Date   Allergy 2007   Cordisone Shot   Cataract    left eye   Constipation    GERD (gastroesophageal reflux disease)    zantac prn   HSV (herpes simplex virus) infection    Hyperlipidemia    tx red yeast rice - now normal per patient     Past Surgical History:  Procedure Laterality Date   APPENDECTOMY     CATARACT EXTRACTION Bilateral    left  07/18/14 right 04/03/20   EXPLORATORY LAPAROTOMY     EYE SURGERY  2016/2021   Detached Retina and Cataract   RETINAL DETACHMENT SURGERY     VITRECTOMY Left    VITRECTOMY AND CATARACT Left    WISDOM TOOTH EXTRACTION     Family History  Problem Relation Age of Onset   Heart disease Mother        CHF   Heart disease Sister        arrythmia   Hypertension Sister    Heart disease Sister        arrythmia   Hypertension Sister    Colon cancer Neg Hx    Esophageal cancer Neg Hx    Liver disease Neg Hx    Social History   Tobacco Use   Smoking status: Never   Smokeless tobacco: Never  Vaping Use   Vaping  status: Never Used  Substance Use Topics   Alcohol use: Yes    Alcohol/week: 1.0 standard drink of alcohol    Types: 1 Glasses of wine per week    Comment: Very little. Beer or wine rarely.   Drug use: No   Current Outpatient Medications  Medication Sig Dispense Refill   acyclovir (ZOVIRAX) 800 MG tablet Take 800 mg TID for 2 days as needed for HSV recurrence 30 tablet 1   Ascorbic Acid (VITAMIN C) 1000 MG tablet Take 1,000 mg by mouth daily.     CALCIUM PO Take 60 mg by mouth. Vit d3     Coenzyme Q10 (COQ10 PO) Take 100 mg by mouth.      COLLAGEN PO Take by mouth.     Inositol Niacinate 500 MG CAPS Take 1 capsule by mouth daily.     lisinopril (ZESTRIL) 5 MG tablet Take 1 tablet (5 mg total) by mouth daily. 90 tablet 3   Magnesium 400 MG CAPS daily.     Multiple Vitamins-Minerals (MULTIVITAMIN ADULTS 50+ PO) Take by mouth.     Omega-3 Fatty Acids (FISH OIL) 1000 MG CAPS Take by mouth.     omeprazole (PRILOSEC) 10 MG capsule Take 1 capsule (10 mg total) by mouth daily. 90 capsule 1   QUERCETIN PO Take 800 mg by mouth.     Red Yeast Rice 600 MG CAPS Take 2 capsules by mouth daily.      VITAMIN D PO Take 50 mcg by mouth.     No current facility-administered medications for this visit.   Allergies  Allergen Reactions   Cortisone Rash     Review of Systems: All systems reviewed and negative except where noted in HPI.   Lab Results  Component Value Date   WBC 5.8 09/30/2023   HGB 14.2 09/30/2023   HCT 43.4 09/30/2023   MCV 99.7 09/30/2023   PLT 301.0 09/30/2023   Lab Results  Component Value Date   NA 140 09/30/2023   CL 102 09/30/2023   K 4.8 09/30/2023   CO2 32 09/30/2023   BUN 14 09/30/2023   CREATININE 0.90 09/30/2023   GFR 63.93 09/30/2023   CALCIUM 9.3 09/30/2023   ALBUMIN 4.4 09/30/2023   GLUCOSE 80 09/30/2023    Lab Results  Component Value Date   ALT 15 09/30/2023   AST 24 09/30/2023   ALKPHOS 63 09/30/2023   BILITOT 0.6 09/30/2023      Physical Exam: BP (!) 140/100   Pulse 88   Ht 5\' 6"  (1.676 m)   Wt 155 lb (70.3 kg)  BMI 25.02 kg/m  Constitutional: Pleasant,well-developed, female in no acute distress. HEENT: Normocephalic and atraumatic. Conjunctivae are normal. No scleral icterus. Neck supple.  Cardiovascular: Normal rate, regular rhythm.  Pulmonary/chest: Effort normal and breath sounds normal.  Abdominal: Soft, nondistended, nontender. There are no masses palpable. No hepatomegaly. Extremities: no edema Lymphadenopathy: No cervical adenopathy noted. Neurological: Alert and oriented to person place and time. Skin: Skin is warm and dry. No rashes noted. Psychiatric: Normal mood and affect. Behavior is normal.   ASSESSMENT: 73 y.o. female here for assessment of the following  1. History of colon polyps   2. Gastroesophageal reflux disease, unspecified whether esophagitis present   3. Long-term current use of proton pump inhibitor therapy    History of 2 adenomas in 2017, she is due for surveillance colonoscopy.  Unfortunately she has had now 2 colonoscopies in her lifetime both of them complicated by retained air from the procedure, I suspect due to angulated hepatic flexure and tortuosity of her colon.  We discussed if she wanted to have a colonoscopy again, ways to minimize her risk for retained air, and balance risks of colonoscopy versus risks of foregoing further surveillance colonoscopy.  She is not a candidate for stool testing or Cologuard given her polyp history and recommend against that.  Ultimately she needs to decide if she wanted to pursue another colonoscopy or not.  If we did pursue colonoscopy, I would use minimal air, and make sure to go back to the cecum at the end of the exam to suctionable air from her colon prior to waking given this recurrence of retained air x 2 in the past.  Following discussion of this, including risks of colonoscopy otherwise, she did want to proceed with colonoscopy  and is not comfortable stopping further surveillance.  I think this is reasonable.  If she has no high risk polyps or no polyps in general, we would likely forego further exams, this may be her last exam.  Otherwise reviewed her history of reflux and long-term use of PPI.  She does have a moderate hiatal hernia on imaging.  We discussed if she wanted to have an endoscopy to screen for Barrett's esophagus, at the same time she is here for her colonoscopy.  We discussed risks and benefits of the procedure and anesthesia and she does wish to have EGD at the same time as her colonoscopy.  We otherwise discussed long-term use of PPI, risks associated with that.  Recommend the lowest daily dose of PPI needed to control symptoms and she is currently doing that.  Otherwise her renal function is normal, normal bone density.  PLAN: - schedule EGD and colonoscopy in the [facility] - will plan on endoscopic decompression at end of the exam to do our best to make sure she is comfortable post procedure, reduce risk for retained air - counseled on reflux, offered EGD for BE screening - counseled on long term risks of chronic PPI use - she is using only 10mg  and working okay for now, continue lowest dose needed to control symptoms - reviewed hiatal hernia, would avoid surgical repair unless symptoms refractory to medication  Harlin Rain, MD [facility]  CC: Copland, Gwenlyn Found, MD

## 2023-12-22 NOTE — Telephone Encounter (Signed)
 Patient called back to obtain her BP readings. She says she's checked it multiple times on different meters at home and the readings are 187/101, 187/108, 185/108, feels her heart is pounding, but it was not high on the readings. She says she's worried that something is wrong. She asks can she take another lisinopril 5 mg to bring it down. I asked if she was relaxed after coming home. She says she sat down for about 10 minutes and relaxed, but the readings are still high. Advised OV needed and first available with PCP is on Thursday morning, she says she will take that appointment. She denies any symptoms of chest pain, blurred vision, dizziness, or any other symptoms. Advised I will send this to Dr. Patsy Lager and someone will call with her recommendation. Patient verbalized understanding.   Reason for Disposition  Systolic BP  >= 180 OR Diastolic >= 110  Protocols used: Blood Pressure - High-A-AH

## 2023-12-22 NOTE — Patient Instructions (Addendum)
 You have been scheduled for a EGD/colonoscopy. Please follow written instructions given to you at your visit today.   If you use inhalers (even only as needed), please bring them with you on the day of your procedure.  DO NOT TAKE 7 DAYS PRIOR TO TEST- Trulicity (dulaglutide) Ozempic, Wegovy (semaglutide) Mounjaro (tirzepatide) Bydureon Bcise (exanatide extended release)  DO NOT TAKE 1 DAY PRIOR TO YOUR TEST Rybelsus (semaglutide) Adlyxin (lixisenatide) Victoza (liraglutide) Byetta (exanatide) ___________________________________________________________________________  Bonita Quin will receive your bowel preparation through [facility], which ensures the lowest copay and home delivery, with outreach via text or call from an [phone] number. Please respond promptly to avoid rescheduling of your procedure. If you are interested in alternative options or have any questions regarding your prep, please contact them at [phone] ____________________________________________________________________________  Your Provider Has Sent Your Bowel Prep Regimen To [facility]   [facility] will contact you to verify your information and collect your copay, if applicable. Enjoy the comfort of your home while your prescription is mailed to you, FREE of any shipping charges.   [facility] accepts all major insurance benefits and applies discounts & coupons.  Have additional questions?   Chat: [email or web address] (quickest way to communicate with them) Call: [phone] Email: [email or web address] NCPDP: [id number]  How will [facility] contact you?  With a Welcome phone call,  a Welcome text and a checkout link in text form.  Texts you receive from [phone] Are NOT Spam.   Thank you for entrusting me with your care and for choosing [facility], Dr. Ileene Patrick

## 2023-12-22 NOTE — Telephone Encounter (Signed)
 Patient called, left VM to return the call to the office to speak to NT.     Copied From CRM [id number]. Reason for Triage: Blood pressure reading of 140/100 that was taken at patient's gastro appointment from this morning. Patient stated she is not experiencing other symptoms besides increased stress levels. Patient stated she will be in a work meeting for the next hour.

## 2023-12-24 ENCOUNTER — Encounter: Payer: Self-pay | Admitting: Family Medicine

## 2023-12-24 ENCOUNTER — Ambulatory Visit (INDEPENDENT_AMBULATORY_CARE_PROVIDER_SITE_OTHER): Admitting: Family Medicine

## 2023-12-24 VITALS — BP 162/80 | HR 74 | Temp 98.0°F | Resp 18 | Ht 66.0 in | Wt 154.0 lb

## 2023-12-24 DIAGNOSIS — I10 Essential (primary) hypertension: Secondary | ICD-10-CM | POA: Diagnosis not present

## 2023-12-24 LAB — BASIC METABOLIC PANEL
BUN: 13 mg/dL (ref 6–23)
CO2: 30 meq/L (ref 19–32)
Calcium: 9.6 mg/dL (ref 8.4–10.5)
Chloride: 101 meq/L (ref 96–112)
Creatinine, Ser: 0.83 mg/dL (ref 0.40–1.20)
GFR: 70.34 mL/min (ref >60.00)
Glucose, Bld: 90 mg/dL (ref 70–99)
Potassium: 4.4 meq/L (ref 3.5–5.1)
Sodium: 139 meq/L (ref 135–145)

## 2023-12-24 NOTE — Patient Instructions (Addendum)
 It was good to see you today  Your BP is high- let's try going up to 10 mg of lisinopril; you can send me a mychart message and update me please.  We can go up a bit more if needed!  If you like try cutting down on salt in your diet a bit  I will be in touch with your labs asap

## 2023-12-28 MED ORDER — HYDROCHLOROTHIAZIDE 25 MG PO TABS
25.0000 mg | ORAL_TABLET | Freq: Every day | ORAL | 3 refills | Status: DC
Start: 2023-12-28 — End: 2024-04-14

## 2024-01-01 MED ORDER — AMLODIPINE BESYLATE 2.5 MG PO TABS: 2.5000 mg | ORAL_TABLET | Freq: Every day | ORAL | 1 refills | Status: DC

## 2024-01-01 NOTE — Addendum Note (Signed)
 Addended by: Abbe Amsterdam C on: 01/01/2024 11:01 AM   Modules accepted: Orders

## 2024-01-12 ENCOUNTER — Other Ambulatory Visit: Payer: Self-pay | Admitting: Family Medicine

## 2024-01-12 DIAGNOSIS — K219 Gastro-esophageal reflux disease without esophagitis: Secondary | ICD-10-CM

## 2024-01-14 ENCOUNTER — Other Ambulatory Visit: Payer: Self-pay | Admitting: Family Medicine

## 2024-01-14 DIAGNOSIS — I10 Essential (primary) hypertension: Secondary | ICD-10-CM

## 2024-01-18 ENCOUNTER — Ambulatory Visit (HOSPITAL_BASED_OUTPATIENT_CLINIC_OR_DEPARTMENT_OTHER)
Admission: RE | Admit: 2024-01-18 | Discharge: 2024-01-18 | Disposition: A | Discharge status: Home / Self Care | Source: Ambulatory Visit | Attending: Family Medicine | Admitting: Family Medicine

## 2024-01-18 DIAGNOSIS — I10 Essential (primary) hypertension: Secondary | ICD-10-CM | POA: Insufficient documentation

## 2024-01-18 LAB — ECHOCARDIOGRAM COMPLETE
AR max vel: 2.16 cm2
AV Area VTI: 2.27 cm2
AV Area mean vel: 2.2 cm2
AV Mean grad: 3 mmHg
AV Peak grad: 6.5 mmHg
Ao pk vel: 1.27 m/s
Area-P 1/2: 3.19 cm2
Calc EF: 63.1 %
MV M vel: 1.77 m/s
MV Peak grad: 12.5 mmHg
S' Lateral: 3.1 cm
Single Plane A2C EF: 61.9 %
Single Plane A4C EF: 61 %

## 2024-01-25 DIAGNOSIS — L718 Other rosacea: Secondary | ICD-10-CM | POA: Diagnosis not present

## 2024-02-02 ENCOUNTER — Encounter: Payer: Self-pay | Admitting: Gastroenterology

## 2024-02-02 ENCOUNTER — Telehealth: Payer: Self-pay | Admitting: Gastroenterology

## 2024-02-02 NOTE — Telephone Encounter (Signed)
 Inbound call from patient stating she was prescribed a new antibiotic medication. Requesting a call to discuss instructions for medication due to 4/23 procedures. Please advise, thank you.

## 2024-02-02 NOTE — Telephone Encounter (Signed)
 Left voicemail for patient to call back.   Patient was recently placed on doxycycline twice daily dosing. Need to know why she was placed on this to determine if we can move forward with scheduled procedures.

## 2024-02-03 NOTE — Telephone Encounter (Signed)
 Left message for patient to call back

## 2024-02-08 NOTE — Telephone Encounter (Signed)
 Left additional message for patient to callback.

## 2024-02-09 ENCOUNTER — Encounter: Payer: Self-pay | Admitting: Certified Registered Nurse Anesthetist

## 2024-02-09 NOTE — Telephone Encounter (Signed)
 Following 3 attempts to reach patient, we have not received any additional correspondence. Will await return call before continuing additional communication attempts.

## 2024-02-10 ENCOUNTER — Encounter: Payer: Self-pay | Admitting: Gastroenterology

## 2024-02-10 ENCOUNTER — Ambulatory Visit: Admitting: Gastroenterology

## 2024-02-10 VITALS — BP 110/60 | HR 86 | Temp 98.2°F | Resp 19 | Ht 66.0 in | Wt 155.0 lb

## 2024-02-10 DIAGNOSIS — D122 Benign neoplasm of ascending colon: Secondary | ICD-10-CM

## 2024-02-10 DIAGNOSIS — K449 Diaphragmatic hernia without obstruction or gangrene: Secondary | ICD-10-CM | POA: Diagnosis not present

## 2024-02-10 DIAGNOSIS — K621 Rectal polyp: Secondary | ICD-10-CM

## 2024-02-10 DIAGNOSIS — K219 Gastro-esophageal reflux disease without esophagitis: Secondary | ICD-10-CM | POA: Diagnosis not present

## 2024-02-10 DIAGNOSIS — D123 Benign neoplasm of transverse colon: Secondary | ICD-10-CM | POA: Diagnosis not present

## 2024-02-10 DIAGNOSIS — E785 Hyperlipidemia, unspecified: Secondary | ICD-10-CM | POA: Diagnosis not present

## 2024-02-10 DIAGNOSIS — K648 Other hemorrhoids: Secondary | ICD-10-CM | POA: Diagnosis not present

## 2024-02-10 DIAGNOSIS — Z1211 Encounter for screening for malignant neoplasm of colon: Secondary | ICD-10-CM

## 2024-02-10 DIAGNOSIS — Q438 Other specified congenital malformations of intestine: Secondary | ICD-10-CM | POA: Diagnosis not present

## 2024-02-10 DIAGNOSIS — D128 Benign neoplasm of rectum: Secondary | ICD-10-CM

## 2024-02-10 DIAGNOSIS — K635 Polyp of colon: Secondary | ICD-10-CM

## 2024-02-10 DIAGNOSIS — Z860100 Personal history of colon polyps, unspecified: Secondary | ICD-10-CM

## 2024-02-10 MED ORDER — FLEET ENEMA RE ENEM
1.0000 | ENEMA | Freq: Once | RECTAL | Status: AC
  Administered 2024-02-10: 1 via RECTAL

## 2024-02-10 MED ORDER — SODIUM CHLORIDE 0.9 % IV SOLN: 500.0000 mL | Freq: Once | INTRAVENOUS | Status: DC

## 2024-02-10 NOTE — Patient Instructions (Signed)
 Please read handouts provided. Continue present medications. Await pathology results. Resume previous diet.   YOU HAD AN ENDOSCOPIC PROCEDURE TODAY AT THE [facility]:   Refer to the procedure report that was given to you for any specific questions about what was found during the examination.  If the procedure report does not answer your questions, please call your gastroenterologist to clarify.  If you requested that your care partner not be given the details of your procedure findings, then the procedure report has been included in a sealed envelope for you to review at your convenience later.  YOU SHOULD EXPECT: Some feelings of bloating in the abdomen. Passage of more gas than usual.  Walking can help get rid of the air that was put into your GI tract during the procedure and reduce the bloating. If you had a lower endoscopy (such as a colonoscopy or flexible sigmoidoscopy) you may notice spotting of blood in your stool or on the toilet paper. If you underwent a bowel prep for your procedure, you may not have a normal bowel movement for a few days.  Please Note:  You might notice some irritation and congestion in your nose or some drainage.  This is from the oxygen used during your procedure.  There is no need for concern and it should clear up in a day or so.  SYMPTOMS TO REPORT IMMEDIATELY:  Following lower endoscopy (colonoscopy or flexible sigmoidoscopy):  Excessive amounts of blood in the stool  Significant tenderness or worsening of abdominal pains  Swelling of the abdomen that is new, acute  Fever of 100F or higher  Following upper endoscopy (EGD)  Vomiting of blood or coffee ground material  New chest pain or pain under the shoulder blades  Painful or persistently difficult swallowing  New shortness of breath  Fever of 100F or higher  Black, tarry-looking stools  For urgent or emergent issues, a gastroenterologist can be reached at any hour by calling [phone]  [phone]. Do not use MyChart messaging for urgent concerns.    DIET:  We do recommend a small meal at first, but then you may proceed to your regular diet.  Drink plenty of fluids but you should avoid alcoholic beverages for 24 hours.  ACTIVITY:  You should plan to take it easy for the rest of today and you should NOT DRIVE or use heavy machinery until tomorrow (because of the sedation medicines used during the test).    FOLLOW UP: Our staff will call the number listed on your records the next business day following your procedure.  We will call around 7:15- 8:00 am to check on you and address any questions or concerns that you may have regarding the information given to you following your procedure. If we do not reach you, we will leave a message.     If any biopsies were taken you will be contacted by phone or by letter within the next 1-3 weeks.  Please call us at [phone] if you have not heard about the biopsies in 3 weeks.    SIGNATURES/CONFIDENTIALITY: You and/or your care partner have signed paperwork which will be entered into your electronic medical record.  These signatures attest to the fact that that the information above on your After Visit Summary has been reviewed and is understood.  Full responsibility of the confidentiality of this discharge information lies with you and/or your care-partner.

## 2024-02-10 NOTE — Progress Notes (Signed)
 VS by DT  Pt's states no medical or surgical changes since previsit or office visit.   Pt states last bowel movement was brown liquid. Dr. General Kenner notified. Verbal order given for fleet enema. Enema given with clear yellow  results. Dr General Kenner notified.

## 2024-02-10 NOTE — Progress Notes (Signed)
1513 HR > 100 with esmolol 25 mg given IV, MD updated, vss 

## 2024-02-10 NOTE — Progress Notes (Signed)
1437 Ephedrine 10 mg given IV due to low BP, MD updated.

## 2024-02-10 NOTE — Op Note (Signed)
 [facility] Patient Name: Clela Hagadorn Procedure Date: 02/10/2024 2:24 PM MRN: [id number] Endoscopist: Landon Pinion P. General Kenner , MD, [id number] Age: 73 Referring MD:  Date of Birth: 09/30/1951 Gender: Female Account #: [id number] Procedure:                Upper GI endoscopy Indications:              history of gastro-esophageal reflux disease, hiatal                            hernia on imaging - screening for Barrett's, on                            omeprazole  Medicines:                Monitored Anesthesia Care Procedure:                Pre-Anesthesia Assessment:                           - Prior to the procedure, a History and Physical                            was performed, and patient medications and                            allergies were reviewed. The patient's tolerance of                            previous anesthesia was also reviewed. The risks                            and benefits of the procedure and the sedation                            options and risks were discussed with the patient.                            All questions were answered, and informed consent                            was obtained. Prior Anticoagulants: The patient has                            taken no anticoagulant or antiplatelet agents. ASA                            Grade Assessment: II - A patient with mild systemic                            disease. After reviewing the risks and benefits,                            the patient was deemed in satisfactory condition to  undergo the procedure.                           After obtaining informed consent, the endoscope was                            passed under direct vision. Throughout the                            procedure, the patient's blood pressure, pulse, and                            oxygen saturations were monitored continuously. The                            GIF [id number] #[id number] was introduced  through the                            mouth, and advanced to the second part of duodenum.                            The upper GI endoscopy was accomplished without                            difficulty. The patient tolerated the procedure                            well. Scope In: Scope Out: Findings:                 Esophagogastric landmarks were identified: the                            Z-line was found at 34 cm, the gastroesophageal                            junction was found at 34 cm and the upper extent of                            the gastric folds was found at 39 cm from the                            incisors.                           A 5 cm hiatal hernia was present.                           The Z-line was slightly irregular but did not meet                            criteria for Barrett's.                           The exam of the esophagus was otherwise normal.  The entire examined stomach was normal.                           The examined duodenum was normal. Complications:            No immediate complications. Estimated blood loss:                            None. Estimated Blood Loss:     Estimated blood loss: none. Impression:               - Esophagogastric landmarks identified.                           - 5 cm hiatal hernia.                           - Z-line irregular but not consistent with Barrett's                           - Normal esophagus otherwise.                           - Normal stomach.                           - Normal examined duodenum. Recommendation:           - Patient has a contact number available for                            emergencies. The signs and symptoms of potential                            delayed complications were discussed with the                            patient. Return to normal activities tomorrow.                            Written discharge instructions were provided to the                             patient.                           - Resume previous diet.                           - Continue present medications. Landon Pinion P. General Kenner, MD 02/10/2024 2:37:59 PM This report has been signed electronically.

## 2024-02-10 NOTE — Progress Notes (Signed)
 Report given to PACU, vss

## 2024-02-10 NOTE — Op Note (Signed)
 [facility] Patient Name: Danielle Daniel Procedure Date: 02/10/2024 2:23 PM MRN: [id number] Endoscopist: Landon Pinion P. General Kenner , MD, [id number] Age: 73 Referring MD:  Date of Birth: Oct 07, 1951 Gender: Female Account #: [id number] Procedure:                Colonoscopy Indications:              High risk colon cancer surveillance: Personal                            history of colonic polyps - adenomas removed                            08/2016 - of note, two prior colonoscopies                            complicated by retained air in the right colon post                            procedure Medicines:                Monitored Anesthesia Care Procedure:                Pre-Anesthesia Assessment:                           - Prior to the procedure, a History and Physical                            was performed, and patient medications and                            allergies were reviewed. The patient's tolerance of                            previous anesthesia was also reviewed. The risks                            and benefits of the procedure and the sedation                            options and risks were discussed with the patient.                            All questions were answered, and informed consent                            was obtained. Prior Anticoagulants: The patient has                            taken no anticoagulant or antiplatelet agents. ASA                            Grade Assessment: II - A patient with mild systemic  disease. After reviewing the risks and benefits,                            the patient was deemed in satisfactory condition to                            undergo the procedure.                           After obtaining informed consent, the colonoscope                            was passed under direct vision. Throughout the                            procedure, the patient's blood pressure, pulse, and                             oxygen saturations were monitored continuously. The                            Olympus Scope SN: [id number] was introduced through                            the anus and advanced to the the cecum, identified                            by appendiceal orifice and ileocecal valve. The                            colonoscopy was technically difficult and complex                            due to a tortuous colon. The patient tolerated the                            procedure well. The quality of the bowel                            preparation was adequate. The ileocecal valve,                            appendiceal orifice, and rectum were photographed. Scope In: 2:39:18 PM Scope Out: 3:22:25 PM Scope Withdrawal Time: 0 hours 34 minutes 51 seconds  Total Procedure Duration: 0 hours 43 minutes 7 seconds  Findings:                 The perianal and digital rectal examinations were                            normal.                           A 3 to 4 mm polyp was found in the ascending colon.  The polyp was flat. The polyp was removed with a                            cold snare. Resection and retrieval were complete.                           A 3 mm polyp was found in the hepatic flexure. The                            polyp was sessile. The polyp was removed with a                            cold snare. Resection and retrieval were complete.                           Four flat polyps were found in the transverse                            colon. The polyps were 4 to 8 mm in size. These                            polyps were removed with a cold snare. Resection                            and retrieval were complete.                           Two sessile polyps were found in the rectum. The                            polyps were diminutive in size. These polyps were                            removed with a cold snare. Resection and retrieval                             were complete.                           Internal hemorrhoids were found during                            retroflexion. The hemorrhoids were small.                           The colon was extremely tortuous and redundant.                            There is an angulated turn entering the hepatic                            flexure.  The exam was otherwise without abnormality. Water                            immersion used for cecal intubation. Extensive                            lavage needed to obtain adequate views in the right                            colon / cecum. Following the end of the procedure,                            cecal intubation again peformed, the right colon                            was dilated with air, all air suctioned from the                            colon upon withdrawal. Complications:            No immediate complications. Estimated blood loss:                            Minimal. Estimated Blood Loss:     Estimated blood loss was minimal. Impression:               - One 3 to 4 mm polyp in the ascending colon,                            removed with a cold snare. Resected and retrieved.                           - One 3 mm polyp at the hepatic flexure, removed                            with a cold snare. Resected and retrieved.                           - Four 4 to 8 mm polyps in the transverse colon,                            removed with a cold snare. Resected and retrieved.                           - Two diminutive polyps in the rectum, removed with                            a cold snare. Resected and retrieved.                           - Internal hemorrhoids.                           - Tortuous / redundant  colon.                           - The examination was otherwise normal.                           - Extensive lavage needed to obtain adequate views                           - Cecal intubation  peformed at the end of the exam                            to withdraw all air given retained air on her prior                            two exams. Recommendation:           - Patient has a contact number available for                            emergencies. The signs and symptoms of potential                            delayed complications were discussed with the                            patient. Return to normal activities tomorrow.                            Written discharge instructions were provided to the                            patient.                           - Resume previous diet.                           - Continue present medications.                           - Await pathology results. Landon Pinion P. Danielle Vanwagoner, MD 02/10/2024 3:30:11 PM This report has been signed electronically.

## 2024-02-10 NOTE — Progress Notes (Signed)
 [facility] History and Physical   Primary Care Physician:  Kaylee Partridge, MD   Reason for Procedure:   History of colon polyps, GERD  Plan:    Colonoscopy, EGD     HPI: Danielle Daniel is a 73 y.o. female  here for EGD and colonoscopy - longstanding GERD, hiatal hernia, on omeprazole  - rule out BE. History of adenomas removed 08/2016. She has had 2 prior colonoscopies with retained air post procedure and discomfort, requiring decompression in the past. We have discussed this, she has a tortous colon, understands risks, wishes to proceed. I will do my best to minimize use of air and try to decompress her colon as best I can prior to withdrawal to reduce her risk for discomfort.    Patient denies any bowel symptoms at this time. No family history of colon cancer known. Otherwise feels well without any cardiopulmonary symptoms.   I have discussed risks / benefits of anesthesia and endoscopic procedure with Danielle Daniel and they wish to proceed with the exams as outlined today.    Past Medical History:  Diagnosis Date   Allergy 2007   Cordisone Shot   Cataract    left eye   Constipation    GERD (gastroesophageal reflux disease)    zantac prn   HSV (herpes simplex virus) infection    Hyperlipidemia    tx red yeast rice - now normal per patient    Past Surgical History:  Procedure Laterality Date   APPENDECTOMY     CATARACT EXTRACTION Bilateral    left  07/18/14 right 04/03/20   EXPLORATORY LAPAROTOMY     EYE SURGERY  2016/2021   Detached Retina and Cataract   RETINAL DETACHMENT SURGERY     VITRECTOMY Left    VITRECTOMY AND CATARACT Left    WISDOM TOOTH EXTRACTION      Prior to Admission medications   Medication Sig Start Date End Date Taking? Authorizing Provider  amLODipine  (NORVASC ) 2.5 MG tablet Take 1 tablet (2.5 mg total) by mouth daily. 01/01/24  Yes Copland, Jessica C, MD  Ascorbic Acid (VITAMIN C) 1000 MG tablet Take 1,000 mg by mouth  daily.   Yes [provider]  CALCIUM PO Take 60 mg by mouth. Vit d3 40mcg   Yes [provider]  Coenzyme Q10 (COQ10 PO) Take 100 mg by mouth.    Yes [provider]  COLLAGEN PO Take by mouth.   Yes [provider]  hydrochlorothiazide  (HYDRODIURIL ) 25 MG tablet Take 1 tablet (25 mg total) by mouth daily. 12/28/23  Yes Copland, Skipper Dumas, MD  Magnesium 400 MG CAPS daily. 10/24/21  Yes [provider]  Multiple Vitamins-Minerals (MULTIVITAMIN ADULTS 50+ PO) Take by mouth.   Yes [provider]  Omega-3 Fatty Acids (FISH OIL) 1000 MG CAPS Take by mouth.   Yes [provider]  omeprazole  (PRILOSEC) 10 MG capsule Take 1 capsule (10 mg total) by mouth daily. 01/12/24  Yes Copland, Skipper Dumas, MD  QUERCETIN PO Take 800 mg by mouth.   Yes [provider]  Red Yeast Rice 600 MG CAPS Take 2 capsules by mouth daily.    Yes [provider]  VITAMIN D  PO Take 50 mcg by mouth.   Yes [provider]  acyclovir  (ZOVIRAX ) 800 MG tablet Take 800 mg TID for 2 days as needed for HSV recurrence 04/01/21   Copland, Skipper Dumas, MD    Current Outpatient Medications  Medication Sig Dispense Refill  amLODipine  (NORVASC ) 2.5 MG tablet Take 1 tablet (2.5 mg total) by mouth daily. 90 tablet 1   Ascorbic Acid (VITAMIN C) 1000 MG tablet Take 1,000 mg by mouth daily.     CALCIUM PO Take 60 mg by mouth. Vit d3 40mcg     Coenzyme Q10 (COQ10 PO) Take 100 mg by mouth.      COLLAGEN PO Take by mouth.     hydrochlorothiazide  (HYDRODIURIL ) 25 MG tablet Take 1 tablet (25 mg total) by mouth daily. 30 tablet 3   Magnesium 400 MG CAPS daily.     Multiple Vitamins-Minerals (MULTIVITAMIN ADULTS 50+ PO) Take by mouth.     Omega-3 Fatty Acids (FISH OIL) 1000 MG CAPS Take by mouth.     omeprazole  (PRILOSEC) 10 MG capsule Take 1 capsule (10 mg total) by mouth daily. 90 capsule 2   QUERCETIN PO Take 800 mg by mouth.     Red Yeast Rice 600 MG CAPS Take  2 capsules by mouth daily.      VITAMIN D  PO Take 50 mcg by mouth.     acyclovir  (ZOVIRAX ) 800 MG tablet Take 800 mg TID for 2 days as needed for HSV recurrence 30 tablet 1   Current Facility-Administered Medications  Medication Dose Route Frequency Provider Last Rate Last Admin   0.9 %  sodium chloride  infusion  500 mL Intravenous Once Jkwon Treptow, Lendon Queen, MD        Allergies as of 02/10/2024 - Review Complete 02/10/2024  Allergen Reaction Noted   Cortisone Rash 10/27/2007    Family History  Problem Relation Age of Onset   Heart disease Mother        CHF   Heart disease Sister        arrythmia   Hypertension Sister    Heart disease Sister        arrythmia   Hypertension Sister    Colon cancer Neg Hx    Esophageal cancer Neg Hx    Liver disease Neg Hx    Rectal cancer Neg Hx    Stomach cancer Neg Hx     Social History   Socioeconomic History   Marital status: Single    Spouse name: Not on file   Number of children: Not on file   Years of education: Not on file   Highest education level: Master's degree (e.g., MA, MS, MEng, MEd, MSW, MBA)  Occupational History   Occupation: retired  Tobacco Use   Smoking status: Never   Smokeless tobacco: Never  Vaping Use   Vaping status: Never Used  Substance and Sexual Activity   Alcohol use: Yes    Alcohol/week: 1.0 standard drink of alcohol    Types: 1 Glasses of wine per week    Comment: Very little. Beer or wine rarely.   Drug use: No   Sexual activity: Not Currently    Birth control/protection: Post-menopausal  Other Topics Concern   Not on file  Social History Narrative   Not on file   Social Drivers of Health   Financial Resource Strain: Low Risk  (12/23/2023)   Overall Financial Resource Strain (CARDIA)    Difficulty of Paying Living Expenses: Not hard at all  Food Insecurity: No Food Insecurity (12/23/2023)   Hunger Vital Sign    Worried About Running Out of Food in the Last Year: Never true    Ran Out of  Food in the Last Year: Never true  Transportation Needs: No Transportation Needs (12/23/2023)   PRAPARE -  Administrator, Civil Service (Medical): No    Lack of Transportation (Non-Medical): No  Physical Activity: Insufficiently Active (12/23/2023)   Exercise Vital Sign    Days of Exercise per Week: 2 days    Minutes of Exercise per Session: 30 min  Stress: No Stress Concern Present (12/23/2023)   [employer] of Occupational Health - Occupational Stress Questionnaire    Feeling of Stress : Not at all  Social Connections: Moderately Integrated (12/23/2023)   Social Connection and Isolation Panel [NHANES]    Frequency of Communication with Friends and Family: More than three times a week    Frequency of Social Gatherings with Friends and Family: More than three times a week    Attends Religious Services: More than 4 times per year    Active Member of [employer] or Organizations: Yes    Attends Engineer, structural: More than 4 times per year    Marital Status: Divorced  Intimate Partner Violence: Not At Risk (06/11/2023)   Humiliation, Afraid, Rape, and Kick questionnaire    Fear of Current or Ex-Partner: No    Emotionally Abused: No    Physically Abused: No    Sexually Abused: No    Review of Systems: All other review of systems negative except as mentioned in the HPI.  Physical Exam: Vital signs BP (!) 156/84   Pulse 94   Temp 98.2 F (36.8 C)   Ht 5\' 6"  (1.676 m)   Wt 155 lb (70.3 kg)   SpO2 96%   BMI 25.02 kg/m   General:   Alert,  Well-developed, pleasant and cooperative in NAD Lungs:  Clear throughout to auscultation.   Heart:  Regular rate and rhythm Abdomen:  Soft, nontender and nondistended.   Neuro/Psych:  Alert and cooperative. Normal mood and affect. A and O x 3  Christi Coward, MD [facility]

## 2024-02-10 NOTE — Progress Notes (Signed)
 1425 Pt spit bite block out and could not place, peds bite block use for case. vss

## 2024-02-10 NOTE — Progress Notes (Signed)
1424 Robinul 0.1 mg IV given due large amount of secretions upon assessment.  MD made aware, vss 

## 2024-02-10 NOTE — Progress Notes (Signed)
 Called to room to assist during endoscopic procedure.  Patient ID and intended procedure confirmed with present staff. Received instructions for my participation in the procedure from the performing physician.

## 2024-02-11 ENCOUNTER — Telehealth: Payer: Self-pay

## 2024-02-11 NOTE — Telephone Encounter (Signed)
 Follow up phone call from yesterday's procedure. No answer, voicemail left for patient to call Dr. Tena Feeling office with any issues.

## 2024-02-15 LAB — SURGICAL PATHOLOGY

## 2024-02-17 ENCOUNTER — Encounter: Payer: Self-pay | Admitting: Gastroenterology

## 2024-02-17 MED ORDER — AMLODIPINE BESYLATE 5 MG PO TABS: 5.0000 mg | ORAL_TABLET | Freq: Every day | ORAL | 3 refills | Status: AC

## 2024-02-17 NOTE — Addendum Note (Signed)
 Addended by: Gates Kasal C on: 02/17/2024 09:14 AM   Modules accepted: Orders

## 2024-02-24 DIAGNOSIS — L718 Other rosacea: Secondary | ICD-10-CM | POA: Diagnosis not present

## 2024-03-24 DIAGNOSIS — H40023 Open angle with borderline findings, high risk, bilateral: Secondary | ICD-10-CM | POA: Diagnosis not present

## 2024-03-24 DIAGNOSIS — H04211 Epiphora due to excess lacrimation, right lacrimal gland: Secondary | ICD-10-CM | POA: Diagnosis not present

## 2024-03-24 DIAGNOSIS — H04123 Dry eye syndrome of bilateral lacrimal glands: Secondary | ICD-10-CM | POA: Diagnosis not present

## 2024-03-24 DIAGNOSIS — H35033 Hypertensive retinopathy, bilateral: Secondary | ICD-10-CM | POA: Diagnosis not present

## 2024-04-05 ENCOUNTER — Telehealth: Payer: Self-pay | Admitting: Family Medicine

## 2024-04-05 NOTE — Telephone Encounter (Signed)
 Copied from CRM [id number]. Topic: Medicare AWV >> Apr 05, 2024 11:40 AM Juliana Ocean wrote: Reason for CRM: LVM 04/05/2024 to schedule AWV. Please schedule Virtual or Telehealth visits ONLY.   Rosalee Collins; Care Guide Ambulatory Clinical Support [place] [facility] Direct Dial: [phone]

## 2024-04-08 ENCOUNTER — Encounter: Payer: Self-pay | Admitting: Family Medicine

## 2024-04-14 ENCOUNTER — Other Ambulatory Visit: Payer: Self-pay | Admitting: Family Medicine

## 2024-04-14 DIAGNOSIS — I10 Essential (primary) hypertension: Secondary | ICD-10-CM

## 2024-04-15 ENCOUNTER — Other Ambulatory Visit: Payer: Self-pay | Admitting: Family Medicine

## 2024-04-15 DIAGNOSIS — I10 Essential (primary) hypertension: Secondary | ICD-10-CM

## 2024-04-15 MED ORDER — HYDROCHLOROTHIAZIDE 12.5 MG PO TABS: 12.5000 mg | ORAL_TABLET | Freq: Every day | ORAL | 3 refills | Status: AC

## 2024-06-14 ENCOUNTER — Ambulatory Visit

## 2024-06-29 ENCOUNTER — Ambulatory Visit (INDEPENDENT_AMBULATORY_CARE_PROVIDER_SITE_OTHER)

## 2024-06-29 VITALS — BP 127/70 | Ht 66.0 in | Wt 150.0 lb

## 2024-06-29 DIAGNOSIS — Z0000 Encounter for general adult medical examination without abnormal findings: Secondary | ICD-10-CM | POA: Diagnosis not present

## 2024-06-29 DIAGNOSIS — Z2821 Immunization not carried out because of patient refusal: Secondary | ICD-10-CM | POA: Diagnosis not present

## 2024-06-29 NOTE — Patient Instructions (Signed)
 Danielle Daniel,  Thank you for taking the time for your Medicare Wellness Visit. I appreciate your continued commitment to your health goals. Please review the care plan we discussed, and feel free to reach out if I can assist you further.  Medicare recommends these wellness visits once per year to help you and your care team stay ahead of potential health issues. These visits are designed to focus on prevention, allowing your provider to concentrate on managing your acute and chronic conditions during your regular appointments.  Please note that Annual Wellness Visits do not include a physical exam. Some assessments may be limited, especially if the visit was conducted virtually. If needed, we may recommend a separate in-person follow-up with your provider.  Ongoing Care Seeing your primary care provider every 3 to 6 months helps us  monitor your health and provide consistent, personalized care.   Referrals If a referral was made during today's visit and you haven't received any updates within two weeks, please contact the referred provider directly to check on the status.  Recommended Screenings:  Health Maintenance  Topic Date Due   COVID-19 Vaccine (1) Never done   Zoster (Shingles) Vaccine (1 of 2) Never done   Pneumococcal Vaccine for age over 15 (1 of 1 - PCV) Never done   Flu Shot  Never done   Medicare Annual Wellness Visit  06/10/2024   DEXA scan (bone density measurement)  09/25/2024   Mammogram  09/30/2024   Colon Cancer Screening  02/10/2027   DTaP/Tdap/Td vaccine (4 - Tdap) 09/06/2029   Hepatitis C Screening  Completed   HPV Vaccine  Aged Out   Meningitis B Vaccine  Aged Out       06/29/2024    8:52 AM  Advanced Directives  Does Patient Have a Medical Advance Directive? Yes  Type of Estate agent of [place];Living will  Copy of Healthcare Power of Attorney in Chart? No - copy requested   Advance Care Planning is important because it: Ensures you  receive medical care that aligns with your values, goals, and preferences. Provides guidance to your family and loved ones, reducing the emotional burden of decision-making during critical moments.  Vision: Annual vision screenings are recommended for early detection of glaucoma, cataracts, and diabetic retinopathy. These exams can also reveal signs of chronic conditions such as diabetes and high blood pressure.  Dental: Annual dental screenings help detect early signs of oral cancer, gum disease, and other conditions linked to overall health, including heart disease and diabetes.  Please see the attached documents for additional preventive care recommendations.

## 2024-06-29 NOTE — Progress Notes (Signed)
 Because this visit was a virtual/telehealth visit,  certain criteria was not obtained, such a blood pressure, CBG if applicable, and timed get up and go. Any medications not marked as taking were not mentioned during the medication reconciliation part of the visit. Any vitals not documented were not able to be obtained due to this being a telehealth visit or patient was unable to self-report a recent blood pressure reading due to a lack of equipment at home via telehealth. Vitals that have been documented are verbally provided by the patient.  This visit was performed by a medical professional under my direct supervision. I was immediately available for consultation/collaboration. I have reviewed and agree with the Annual Wellness Visit documentation.  Subjective:   Danielle Daniel is a 73 y.o. who presents for a Medicare Wellness preventive visit.  As a reminder, Annual Wellness Visits don't include a physical exam, and some assessments may be limited, especially if this visit is performed virtually. We may recommend an in-person follow-up visit with your provider if needed.  Visit Complete: Virtual I connected with  Chaye Misch on 06/29/24 by a audio enabled telemedicine application and verified that I am speaking with the correct person using two identifiers.  Patient Location: Home  Provider Location: Home Office  I discussed the limitations of evaluation and management by telemedicine. The patient expressed understanding and agreed to proceed.  Vital Signs: Because this visit was a virtual/telehealth visit, some criteria may be missing or patient reported. Any vitals not documented were not able to be obtained and vitals that have been documented are patient reported.  VideoDeclined- This patient declined Librarian, academic. Therefore the visit was completed with audio only.  Persons Participating in Visit: Patient.  AWV Questionnaire: Yes: Patient  Medicare AWV questionnaire was completed by the patient on 06/22/2024; I have confirmed that all information answered by patient is correct and no changes since this date.  Cardiac Risk Factors include: advanced age (>45men, >12 women)     Objective:    Today's Vitals   06/29/24 0853  BP: 127/70  Weight: 150 lb (68 kg)  Height: 5' 6 (1.676 m)   Body mass index is 24.21 kg/m.     06/29/2024    8:52 AM 06/11/2023    9:09 AM 06/02/2022    9:04 AM 05/27/2021    1:44 PM 03/04/2018    3:13 PM 08/28/2016    9:07 AM 08/13/2016    2:24 PM  Advanced Directives  Does Patient Have a Medical Advance Directive? Yes Yes Yes No Yes  Yes  Yes   Type of Estate agent of [place];Living will Living will Healthcare Power of [place];Living will  Healthcare Power of [place];Living will Healthcare Power of [place];Living will  Healthcare Power of [place];Living will   Does patient want to make changes to medical advance directive?      No - Patient declined    Copy of Healthcare Power of Attorney in Chart? No - copy requested  No - copy requested  No - copy requested  No - copy requested    Would patient like information on creating a medical advance directive?    Yes (MAU/Ambulatory/Procedural Areas - Information given)        Data saved with a previous flowsheet row definition    Current Medications (verified) Outpatient Encounter Medications as of 06/29/2024  Medication Sig   acyclovir  (ZOVIRAX ) 800 MG tablet Take 800 mg TID for 2 days as needed for  HSV recurrence   amLODipine  (NORVASC ) 5 MG tablet Take 1 tablet (5 mg total) by mouth daily.   Ascorbic Acid (VITAMIN C) 1000 MG tablet Take 1,000 mg by mouth daily.   CALCIUM PO Take 60 mg by mouth. Vit d3 40mcg   Coenzyme Q10 (COQ10 PO) Take 100 mg by mouth.    COLLAGEN PO Take by mouth.   hydrochlorothiazide  (HYDRODIURIL ) 12.5 MG tablet Take 1 tablet (12.5 mg total) by mouth daily.   Magnesium 400 MG CAPS daily.   Multiple  Vitamins-Minerals (MULTIVITAMIN ADULTS 50+ PO) Take by mouth.   Omega-3 Fatty Acids (FISH OIL) 1000 MG CAPS Take by mouth.   omeprazole  (PRILOSEC) 10 MG capsule Take 1 capsule (10 mg total) by mouth daily.   QUERCETIN PO Take 800 mg by mouth.   Red Yeast Rice 600 MG CAPS Take 2 capsules by mouth daily.    VITAMIN D  PO Take 50 mcg by mouth.   No facility-administered encounter medications on file as of 06/29/2024.    Allergies (verified) Cortisone   History: Past Medical History:  Diagnosis Date   Allergy 2007   Cordisone Shot   Cataract    left eye   Constipation    GERD (gastroesophageal reflux disease)    zantac prn   HSV (herpes simplex virus) infection    Hyperlipidemia    tx red yeast rice - now normal per patient   Past Surgical History:  Procedure Laterality Date   APPENDECTOMY     CATARACT EXTRACTION Bilateral    left  07/18/14 right 04/03/20   EXPLORATORY LAPAROTOMY     EYE SURGERY  2016/2021   Detached Retina and Cataract   RETINAL DETACHMENT SURGERY     VITRECTOMY Left    VITRECTOMY AND CATARACT Left    WISDOM TOOTH EXTRACTION     Family History  Problem Relation Age of Onset   Heart disease Mother        CHF   Heart disease Sister        arrythmia   Hypertension Sister    Heart disease Sister        arrythmia   Hypertension Sister    Colon cancer Neg Hx    Esophageal cancer Neg Hx    Liver disease Neg Hx    Rectal cancer Neg Hx    Stomach cancer Neg Hx    Social History   Socioeconomic History   Marital status: Single    Spouse name: Not on file   Number of children: Not on file   Years of education: Not on file   Highest education level: Master's degree (e.g., MA, MS, MEng, MEd, MSW, MBA)  Occupational History   Occupation: retired  Tobacco Use   Smoking status: Never   Smokeless tobacco: Never  Vaping Use   Vaping status: Never Used  Substance and Sexual Activity   Alcohol use: Yes    Alcohol/week: 1.0 standard drink of alcohol     Types: 1 Glasses of wine per week    Comment: Very little. Beer or wine rarely.   Drug use: No   Sexual activity: Not Currently    Birth control/protection: Post-menopausal  Other Topics Concern   Not on file  Social History Narrative   Not on file   Social Drivers of Health   Financial Resource Strain: Low Risk  (06/22/2024)   Overall Financial Resource Strain (CARDIA)    Difficulty of Paying Living Expenses: Not hard at all  Food Insecurity: No  Food Insecurity (06/22/2024)   Hunger Vital Sign    Worried About Running Out of Food in the Last Year: Never true    Ran Out of Food in the Last Year: Never true  Transportation Needs: No Transportation Needs (06/22/2024)   PRAPARE - Administrator, Civil Service (Medical): No    Lack of Transportation (Non-Medical): No  Physical Activity: Insufficiently Active (06/22/2024)   Exercise Vital Sign    Days of Exercise per Week: 1 day    Minutes of Exercise per Session: 60 min  Stress: No Stress Concern Present (06/22/2024)   [employer] of Occupational Health - Occupational Stress Questionnaire    Feeling of Stress: Not at all  Social Connections: Moderately Integrated (06/22/2024)   Social Connection and Isolation Panel    Frequency of Communication with Friends and Family: More than three times a week    Frequency of Social Gatherings with Friends and Family: Twice a week    Attends Religious Services: More than 4 times per year    Active Member of [employer] or Organizations: Yes    Attends Engineer, structural: More than 4 times per year    Marital Status: Divorced    Tobacco Counseling Counseling given: Not Answered    Clinical Intake:  Pre-visit preparation completed: Yes  Pain : No/denies pain     BMI - recorded: 24.21 Nutritional Status: BMI of 19-24  Normal Nutritional Risks: None Diabetes: No  Lab Results  Component Value Date   HGBA1C 5.3 09/30/2023   HGBA1C 5.4 09/24/2022   HGBA1C 5.5  09/19/2021     How often do you need to have someone help you when you read instructions, pamphlets, or other written materials from your doctor or pharmacy?: 1 - Never  Interpreter Needed?: No  Information entered by :: Jerry Clyne,CMA   Activities of Daily Living     06/22/2024    9:27 AM 06/07/2024    8:11 AM  In your present state of health, do you have any difficulty performing the following activities:  Hearing? 0 0  Vision? 0 0  Difficulty concentrating or making decisions? 0 0  Walking or climbing stairs? 0 0  Dressing or bathing? 0 0  Doing errands, shopping? 0 0  Preparing Food and eating ? N N  Using the Toilet? N N  In the past six months, have you accidently leaked urine? N N  Do you have problems with loss of bowel control? N N  Managing your Medications? N N  Managing your Finances? N N  Housekeeping or managing your Housekeeping? N N    Patient Care Team: Copland, Harlene BROCKS, MD as PCP - General (Family Medicine) Jarold Mayo, MD as Consulting Physician (Ophthalmology) Armbruster, Elspeth SQUIBB, MD as Consulting Physician (Gastroenterology) Cleatus Collar, MD as Consulting Physician (Ophthalmology) Porter Andrez SAUNDERS, PA-C (Inactive) as Physician Assistant (Dermatology)  I have updated your Care Teams any recent Medical Services you may have received from other providers in the past year.     Assessment:   This is a routine wellness examination for Danielle Daniel.  Hearing/Vision screen Hearing Screening - Comments:: No difficulties  Vision Screening - Comments:: Patient wears glasses   Goals Addressed             This Visit's Progress    Patient Stated   On track    Increase physical activity       Depression Screen     06/29/2024    8:56  AM 06/11/2023    9:10 AM 09/24/2022    8:14 AM 06/02/2022    9:07 AM 09/19/2021    8:22 AM 05/27/2021    1:46 PM 05/08/2021    3:28 PM  PHQ 2/9 Scores  PHQ - 2 Score 0 0 0 0 0 0 0  PHQ- 9 Score 0           Fall Risk     06/22/2024    9:27 AM 06/07/2024    8:11 AM 06/11/2023    8:31 AM 09/24/2022    8:14 AM 06/02/2022    9:06 AM  Fall Risk   Falls in the past year? 0 1 0 0 0  Number falls in past yr: 0 0 0 0 0  Injury with Fall? 0 0 0 0 0  Risk for fall due to : History of fall(s)  No Fall Risks No Fall Risks   Follow up Falls evaluation completed;Education provided;Falls prevention discussed  Falls evaluation completed Falls evaluation completed  Falls prevention discussed      Data saved with a previous flowsheet row definition    MEDICARE RISK AT HOME:  Medicare Risk at Home Any stairs in or around the home?: (Patient-Rptd) Yes If so, are there any without handrails?: (Patient-Rptd) No Home free of loose throw rugs in walkways, pet beds, electrical cords, etc?: (Patient-Rptd) No Adequate lighting in your home to reduce risk of falls?: (Patient-Rptd) Yes Life alert?: (Patient-Rptd) No Use of a cane, walker or w/c?: (Patient-Rptd) No Grab bars in the bathroom?: (Patient-Rptd) No Shower chair or bench in shower?: (Patient-Rptd) Yes Elevated toilet seat or a handicapped toilet?: (Patient-Rptd) No  TIMED UP AND GO:  Was the test performed?  No  Cognitive Function: 6CIT completed    08/28/2016    9:08 AM  MMSE - Mini Mental State Exam  Orientation to time 5   Orientation to Place 5   Registration 3   Attention/ Calculation 5   Recall 3   Language- name 2 objects 2   Language- repeat 1  Language- follow 3 step command 3   Language- read & follow direction 1   Write a sentence 1   Copy design 1   Total score 30      Data saved with a previous flowsheet row definition        06/29/2024    8:57 AM 06/11/2023    9:12 AM  6CIT Screen  What Year? 0 points 0 points  What month? 0 points 0 points  What time? 0 points 0 points  Count back from 20 0 points 0 points  Months in reverse 0 points 0 points  Repeat phrase 0 points 0 points  Total Score 0 points 0 points     Immunizations Immunization History  Administered Date(s) Administered   Td 10/20/1998, 12/12/2008, 09/07/2019    Screening Tests Health Maintenance  Topic Date Due   COVID-19 Vaccine (1) Never done   Zoster Vaccines- Shingrix (1 of 2) Never done   Pneumococcal Vaccine: 50+ Years (1 of 1 - PCV) Never done   Influenza Vaccine  Never done   Medicare Annual Wellness (AWV)  06/10/2024   DEXA SCAN  09/25/2024   MAMMOGRAM  09/30/2024   Colonoscopy  02/10/2027   DTaP/Tdap/Td (4 - Tdap) 09/06/2029   Hepatitis C Screening  Completed   HPV VACCINES  Aged Out   Meningococcal B Vaccine  Aged Out    Health Maintenance Items Addressed:patient declined vaccinations  Additional Screening:  Vision Screening: Recommended annual ophthalmology exams for early detection of glaucoma and other disorders of the eye. Is the patient up to date with their annual eye exam?  No  Who is the provider or what is the name of the office in which the patient attends annual eye exams?   Dental Screening: Recommended annual dental exams for proper oral hygiene  Community Resource Referral / Chronic Care Management: CRR required this visit?  No   CCM required this visit?  No   Plan:    I have personally reviewed and noted the following in the patient's chart:   Medical and social history Use of alcohol, tobacco or illicit drugs  Current medications and supplements including opioid prescriptions. Patient is not currently taking opioid prescriptions. Functional ability and status Nutritional status Physical activity Advanced directives List of other physicians Hospitalizations, surgeries, and ER visits in previous 12 months Vitals Screenings to include cognitive, depression, and falls Referrals and appointments  In addition, I have reviewed and discussed with patient certain preventive protocols, quality metrics, and best practice recommendations. A written personalized care plan for  preventive services as well as general preventive health recommendations were provided to patient.   Lyle MARLA Right, [place]   06/29/2024   After Visit Summary: (MyChart) Due to this being a telephonic visit, the after visit summary with patients personalized plan was offered to patient via MyChart   Notes: Nothing significant to report at this time.

## 2024-08-17 ENCOUNTER — Other Ambulatory Visit (HOSPITAL_BASED_OUTPATIENT_CLINIC_OR_DEPARTMENT_OTHER): Payer: Self-pay | Admitting: Family Medicine

## 2024-08-17 DIAGNOSIS — Z1231 Encounter for screening mammogram for malignant neoplasm of breast: Secondary | ICD-10-CM

## 2024-09-27 DIAGNOSIS — H02834 Dermatochalasis of left upper eyelid: Secondary | ICD-10-CM | POA: Diagnosis not present

## 2024-09-27 DIAGNOSIS — H02831 Dermatochalasis of right upper eyelid: Secondary | ICD-10-CM | POA: Diagnosis not present

## 2024-09-27 DIAGNOSIS — H40023 Open angle with borderline findings, high risk, bilateral: Secondary | ICD-10-CM | POA: Diagnosis not present

## 2024-10-02 NOTE — Progress Notes (Unsigned)
 [facility] [address], [address] [place], [place] [address] [phone]  Date:  10/05/2024   Name:  Danielle Daniel   DOB:  Feb 10, 1951   MRN:  [id number]  PCP:  Watt Harlene BROCKS, MD    Chief Complaint: No chief complaint on file.   History of Present Illness:  Danielle Daniel is a 73 y.o. very pleasant female patient who presents with the following:  Patient seen today for physical exam.  I saw her most recently in March of this year for high blood pressure.  She had recently seen her gastroenterologist and her blood pressure was quite elevated unexpectedly-at that time she was taking lisinopril  5 mg which had typically been off Otherwise she has history of GERD We increased her dose of lisinopril  and got an echo radiogram which was reassuring We then ended up changing over to hydrochlorothiazide  and amlodipine -no longer taking lisinopril   -Bone density scan -Shingrix -can offer pneumococcal vaccination -Flu shot - Mammogram is due this month - She just had a colonoscopy earlier this year - Update labs today  Discussed the use of AI scribe software for clinical note transcription with the patient, who gave verbal consent to proceed.  History of Present Illness    Patient Active Problem List   Diagnosis Date Noted   Eustachian tube dysfunction 10/10/2014   Lymphadenopathy 10/10/2014   Trapezius muscle spasm 05/19/2013   Subjective visual disturbance 12/20/2010   GERD 11/07/2010   Dysphagia 11/07/2010   CONSTIPATION, CHRONIC 12/18/2009   Sprain of hand 10/27/2007    Past Medical History:  Diagnosis Date   Allergy 2007   Cordisone Shot   Cataract    left eye   Constipation    GERD (gastroesophageal reflux disease)    zantac prn   HSV (herpes simplex virus) infection    Hyperlipidemia    tx red yeast rice - now normal per patient    Past Surgical History:  Procedure Laterality Date    APPENDECTOMY     CATARACT EXTRACTION Bilateral    left  07/18/14 right 04/03/20   EXPLORATORY LAPAROTOMY     EYE SURGERY  2016/2021   Detached Retina and Cataract   RETINAL DETACHMENT SURGERY     VITRECTOMY Left    VITRECTOMY AND CATARACT Left    WISDOM TOOTH EXTRACTION      Social History[1]  Family History  Problem Relation Age of Onset   Heart disease Mother        CHF   Heart disease Sister        arrythmia   Hypertension Sister    Heart disease Sister        arrythmia   Hypertension Sister    Colon cancer Neg Hx    Esophageal cancer Neg Hx    Liver disease Neg Hx    Rectal cancer Neg Hx    Stomach cancer Neg Hx     Allergies[2]  Medication list has been reviewed and updated.  Medications Ordered Prior to Encounter[3]  Review of Systems:  As per HPI- otherwise negative.   Physical Examination: There were no vitals filed for this visit. There were no vitals filed for this visit. There is no height or weight on file to calculate BMI. Ideal Body Weight:    GEN: no acute distress. HEENT: Atraumatic, Normocephalic.  Ears and Nose: No external deformity. CV: RRR, No M/G/R. No JVD. No thrill. No extra heart sounds.  PULM: CTA B, no wheezes, crackles, rhonchi. No retractions. No resp. distress. No accessory muscle use. ABD: S, NT, ND, +BS. No rebound. No HSM. EXTR: No c/c/e PSYCH: Normally interactive. Conversant.    Assessment and Plan: No diagnosis found.  Assessment & Plan   Signed Harlene Schroeder, MD    [1]  Social History Tobacco Use   Smoking status: Never   Smokeless tobacco: Never  Vaping Use   Vaping status: Never Used  Substance Use Topics   Alcohol use: Yes    Alcohol/week: 1.0 standard drink of alcohol    Types: 1 Glasses of wine per week    Comment: Very little. Beer or wine rarely.   Drug use: No  [2]  Allergies Allergen Reactions   Cortisone Rash  [3]  Current Outpatient Medications on File Prior to Visit  Medication Sig  Dispense Refill   acyclovir  (ZOVIRAX ) 800 MG tablet Take 800 mg TID for 2 days as needed for HSV recurrence 30 tablet 1   amLODipine  (NORVASC ) 5 MG tablet Take 1 tablet (5 mg total) by mouth daily. 90 tablet 3   Ascorbic Acid (VITAMIN C) 1000 MG tablet Take 1,000 mg by mouth daily.     CALCIUM PO Take 60 mg by mouth. Vit d3 40mcg     Coenzyme Q10 (COQ10 PO) Take 100 mg by mouth.      COLLAGEN PO Take by mouth.     hydrochlorothiazide  (HYDRODIURIL ) 12.5 MG tablet Take 1 tablet (12.5 mg total) by mouth daily. 90 tablet 3   Magnesium 400 MG CAPS daily.     Multiple Vitamins-Minerals (MULTIVITAMIN ADULTS 50+ PO) Take by mouth.     Omega-3 Fatty Acids (FISH OIL) 1000 MG CAPS Take by mouth.     omeprazole  (PRILOSEC) 10 MG capsule Take 1 capsule (10 mg total) by mouth daily. 90 capsule 2   QUERCETIN PO Take 800 mg by mouth.     Red Yeast Rice 600 MG CAPS Take 2 capsules by mouth daily.      VITAMIN D  PO Take 50 mcg by mouth.     No current facility-administered medications on file prior to visit.

## 2024-10-02 NOTE — Patient Instructions (Signed)
 It was good to see you today, I will be in touch with your lab work

## 2024-10-05 ENCOUNTER — Encounter (HOSPITAL_BASED_OUTPATIENT_CLINIC_OR_DEPARTMENT_OTHER): Payer: Self-pay

## 2024-10-05 ENCOUNTER — Encounter: Payer: Self-pay | Admitting: Family Medicine

## 2024-10-05 ENCOUNTER — Ambulatory Visit (HOSPITAL_BASED_OUTPATIENT_CLINIC_OR_DEPARTMENT_OTHER)
Admission: RE | Admit: 2024-10-05 | Discharge: 2024-10-05 | Disposition: A | Discharge status: Home / Self Care | Source: Ambulatory Visit | Attending: Family Medicine | Admitting: Family Medicine

## 2024-10-05 ENCOUNTER — Ambulatory Visit: Payer: Medicare HMO | Admitting: Family Medicine

## 2024-10-05 ENCOUNTER — Inpatient Hospital Stay (HOSPITAL_BASED_OUTPATIENT_CLINIC_OR_DEPARTMENT_OTHER): Admission: RE | Admit: 2024-10-05 | Discharge: 2024-10-05 | Attending: Family Medicine | Admitting: Family Medicine

## 2024-10-05 VITALS — BP 136/68 | HR 87 | Temp 97.8°F | Ht 66.0 in | Wt 152.0 lb

## 2024-10-05 DIAGNOSIS — K219 Gastro-esophageal reflux disease without esophagitis: Secondary | ICD-10-CM | POA: Diagnosis not present

## 2024-10-05 DIAGNOSIS — E2839 Other primary ovarian failure: Secondary | ICD-10-CM | POA: Insufficient documentation

## 2024-10-05 DIAGNOSIS — I10 Essential (primary) hypertension: Secondary | ICD-10-CM

## 2024-10-05 DIAGNOSIS — E785 Hyperlipidemia, unspecified: Secondary | ICD-10-CM

## 2024-10-05 DIAGNOSIS — Z1329 Encounter for screening for other suspected endocrine disorder: Secondary | ICD-10-CM | POA: Diagnosis not present

## 2024-10-05 DIAGNOSIS — Z130 Encounter for screening for diseases of the blood and blood-forming organs and certain disorders involving the immune mechanism: Secondary | ICD-10-CM | POA: Diagnosis not present

## 2024-10-05 DIAGNOSIS — Z1231 Encounter for screening mammogram for malignant neoplasm of breast: Secondary | ICD-10-CM | POA: Diagnosis present

## 2024-10-05 DIAGNOSIS — Z131 Encounter for screening for diabetes mellitus: Secondary | ICD-10-CM | POA: Diagnosis not present

## 2024-10-05 DIAGNOSIS — Z0000 Encounter for general adult medical examination without abnormal findings: Secondary | ICD-10-CM

## 2024-10-05 LAB — CBC
HCT: 40.2 % (ref 36.0–46.0)
Hemoglobin: 13.9 g/dL (ref 12.0–15.0)
MCHC: 34.6 g/dL (ref 30.0–36.0)
MCV: 95.3 fl (ref 78.0–100.0)
Platelets: 351 K/uL (ref 150.0–400.0)
RBC: 4.22 Mil/uL (ref 3.87–5.11)
RDW: 13.1 % (ref 11.5–15.5)
WBC: 6.8 K/uL (ref 4.0–10.5)

## 2024-10-05 LAB — COMPREHENSIVE METABOLIC PANEL WITH GFR
ALT: 16 U/L (ref 3–35)
AST: 28 U/L (ref 5–37)
Albumin: 4.6 g/dL (ref 3.5–5.2)
Alkaline Phosphatase: 60 U/L (ref 39–117)
BUN: 14 mg/dL (ref 6–23)
CO2: 33 meq/L — ABNORMAL HIGH (ref 19–32)
Calcium: 9.8 mg/dL (ref 8.4–10.5)
Chloride: 100 meq/L (ref 96–112)
Creatinine, Ser: 0.84 mg/dL (ref 0.40–1.20)
GFR: 68.95 mL/min (ref >60.00)
Glucose, Bld: 86 mg/dL (ref 70–99)
Potassium: 4.4 meq/L (ref 3.5–5.1)
Sodium: 142 meq/L (ref 135–145)
Total Bilirubin: 0.4 mg/dL (ref 0.2–1.2)
Total Protein: 7.3 g/dL (ref 6.0–8.3)

## 2024-10-05 LAB — LIPID PANEL
Cholesterol: 229 mg/dL — ABNORMAL HIGH (ref 28–200)
HDL: 80.4 mg/dL (ref >39.00)
LDL Cholesterol: 136 mg/dL — ABNORMAL HIGH (ref 10–99)
NonHDL: 148.8
Total CHOL/HDL Ratio: 3
Triglycerides: 64 mg/dL (ref 10.0–149.0)
VLDL: 12.8 mg/dL (ref 0.0–40.0)

## 2024-10-05 LAB — HEMOGLOBIN A1C: Hgb A1c MFr Bld: 5.4 % (ref 4.6–6.5)

## 2024-10-05 LAB — TSH: TSH: 5.46 u[IU]/mL (ref 0.35–5.50)

## 2024-10-05 MED ORDER — OMEPRAZOLE 10 MG PO CPDR: 10.0000 mg | DELAYED_RELEASE_CAPSULE | Freq: Every day | ORAL | 3 refills | Status: AC

## 2024-10-14 ENCOUNTER — Telehealth: Admitting: Family Medicine

## 2024-10-14 DIAGNOSIS — R059 Cough, unspecified: Secondary | ICD-10-CM | POA: Diagnosis not present

## 2024-10-14 MED ORDER — BENZONATATE 100 MG PO CAPS: 100.0000 mg | ORAL_CAPSULE | Freq: Three times a day (TID) | ORAL | 0 refills | Status: AC | PRN

## 2024-10-14 NOTE — Progress Notes (Signed)
 We are sorry that you are not feeling well.  Here is how we plan to help!  Based on your presentation I believe you most likely have A cough due to a virus.  This is called viral bronchitis and is best treated by rest, plenty of fluids and control of the cough.  You may use Ibuprofen or Tylenol as directed to help your symptoms.     In addition you may use A non-prescription cough medication called Coricidin that you can purchase over the counter. and A prescription cough medication called Tessalon  Perles 100mg . You may take 1-2 capsules every 8 hours as needed for your cough.    From your responses in the eVisit questionnaire you describe inflammation in the upper respiratory tract which is causing a significant cough.  This is commonly called Bronchitis and has four common causes:   Allergies Viral Infections Acid Reflux Bacterial Infection Allergies, viruses and acid reflux are treated by controlling symptoms or eliminating the cause. An example might be a cough caused by taking certain blood pressure medications. You stop the cough by changing the medication. Another example might be a cough caused by acid reflux. Controlling the reflux helps control the cough.  USE OF BRONCHODILATOR (RESCUE) INHALERS: There is a risk from using your bronchodilator too frequently.  The risk is that over-reliance on a medication which only relaxes the muscles surrounding the breathing tubes can reduce the effectiveness of medications prescribed to reduce swelling and congestion of the tubes themselves.  Although you feel brief relief from the bronchodilator inhaler, your asthma may actually be worsening with the tubes becoming more swollen and filled with mucus.  This can delay other crucial treatments, such as oral steroid medications. If you need to use a bronchodilator inhaler daily, several times per day, you should discuss this with your provider.  There are probably better treatments that could be used to  keep your asthma under control.     HOME CARE Only take medications as instructed by your medical team. Complete the entire course of an antibiotic. Drink plenty of fluids and get plenty of rest. Avoid close contacts especially the very young and the elderly Cover your mouth if you cough or cough into your sleeve. Always remember to wash your hands A steam or ultrasonic humidifier can help congestion.   GET HELP RIGHT AWAY IF: You develop worsening fever. You become short of breath You cough up blood. Your symptoms persist after you have completed your treatment plan MAKE SURE YOU  Understand these instructions. Will watch your condition. Will get help right away if you are not doing well or get worse.  Your e-visit answers were reviewed by a board certified advanced clinical practitioner to complete your personal care plan.  Depending on the condition, your plan could have included both over the counter or prescription medications. If there is a problem please reply  once you have received a response from your provider. Your safety is important to us .  If you have drug allergies check your prescription carefully.    You can use MyChart to ask questions about todays visit, request a non-urgent call back, or ask for a work or school excuse for 24 hours related to this e-Visit. If it has been greater than 24 hours you will need to follow up with your provider, or enter a new e-Visit to address those concerns. You will get an e-mail in the next two days asking about your experience.  I hope that your  e-visit has been valuable and will speed your recovery. Thank you for using e-visits.   I have spent 5 minutes in review of e-visit questionnaire, review and updating patient chart, medical decision making and response to patient.   Roosvelt Mater, PA-C

## 2024-11-14 ENCOUNTER — Encounter: Payer: Self-pay | Admitting: Family Medicine

## 2025-07-06 ENCOUNTER — Ambulatory Visit

## 2025-10-09 ENCOUNTER — Encounter: Admitting: Family Medicine
